# Patient Record
Sex: Male | Born: 1955 | Race: White | Hispanic: No | Marital: Married | Smoking: Current every day smoker
Health system: Southern US, Community
[De-identification: ages and names within clinical notes are randomized; demographics above are authoritative.]

## PROBLEM LIST (undated history)

## (undated) DIAGNOSIS — E785 Hyperlipidemia, unspecified: Secondary | ICD-10-CM

## (undated) DIAGNOSIS — I200 Unstable angina: Secondary | ICD-10-CM

## (undated) HISTORY — PX: CORONARY ANGIOPLASTY WITH STENT PLACEMENT: SHX49

## (undated) HISTORY — PX: APPENDECTOMY: SHX54

---

## 2015-03-16 LAB — HM HEPATITIS C SCREENING LAB: HM HEPATITIS C SCREENING: NEGATIVE

## 2015-03-16 LAB — HM HIV SCREENING LAB: HM HIV SCREENING: NEGATIVE

## 2017-03-20 ENCOUNTER — Encounter: Payer: Self-pay | Admitting: Physician Assistant

## 2017-03-20 ENCOUNTER — Ambulatory Visit (INDEPENDENT_AMBULATORY_CARE_PROVIDER_SITE_OTHER): Payer: BLUE CROSS/BLUE SHIELD | Admitting: Physician Assistant

## 2017-03-20 VITALS — BP 112/80 | HR 72 | Temp 98.5°F | Resp 16 | Ht 70.0 in | Wt 144.0 lb

## 2017-03-20 DIAGNOSIS — Z2821 Immunization not carried out because of patient refusal: Secondary | ICD-10-CM | POA: Diagnosis not present

## 2017-03-20 DIAGNOSIS — Z1329 Encounter for screening for other suspected endocrine disorder: Secondary | ICD-10-CM

## 2017-03-20 DIAGNOSIS — Z1211 Encounter for screening for malignant neoplasm of colon: Secondary | ICD-10-CM

## 2017-03-20 DIAGNOSIS — Z0000 Encounter for general adult medical examination without abnormal findings: Secondary | ICD-10-CM | POA: Diagnosis not present

## 2017-03-20 DIAGNOSIS — R079 Chest pain, unspecified: Secondary | ICD-10-CM

## 2017-03-20 DIAGNOSIS — Z13220 Encounter for screening for lipoid disorders: Secondary | ICD-10-CM

## 2017-03-20 DIAGNOSIS — Z720 Tobacco use: Secondary | ICD-10-CM | POA: Insufficient documentation

## 2017-03-20 DIAGNOSIS — Z125 Encounter for screening for malignant neoplasm of prostate: Secondary | ICD-10-CM | POA: Diagnosis not present

## 2017-03-20 DIAGNOSIS — I200 Unstable angina: Secondary | ICD-10-CM

## 2017-03-20 DIAGNOSIS — Z131 Encounter for screening for diabetes mellitus: Secondary | ICD-10-CM | POA: Diagnosis not present

## 2017-03-20 NOTE — Patient Instructions (Signed)

## 2017-03-20 NOTE — Progress Notes (Signed)
Patient: Xavier Harrison, Male    DOB: 06/17/1955, 61 y.o.   MRN: [id number] Visit Date: 03/20/2017  Today's Provider: Trinna Post, PA-C   Chief Complaint  Patient presents with  . Establish Care  . Annual Exam   Subjective:    Annual physical exam Xavier Harrison is a 61 y.o. male who presents today for health maintenance and complete physical. He feels fairly well.  Was previously seen at [facility] in [place], changed insurances since then.   He lives with his wife of 40 years. He has step children. Used to work as a Designer, industrial/product, now retired.  He smokes one half a pack per day for the past 5-10 years. Otherwise, he had smoked 0.75 packs since he was 18. He drinks a wine every month or so. Does not use drugs. Has had pneumonia and tetanus vaccinations in 2016.  He has not had a colonoscopy. Agreeable to Cologuard. No history of colon cancer in family. No history of prostate cancer in family. Declines PSA today. HIV and Hep C testing negative in 2016. Declines flu shot today.  Pt reports several episodes of exertional chest pain. Once was when he was building a wheelchair ramp for a neighbor and noticed some chest pain afterward about two weeks ago. He says it improved within 15-20 minutes of stopping activity, but the pain/pulling comes back when he lifts heavy objects. He describes it feeling a bit like heartburn.  He reports exercising regularly.     -----------------------------------------------------------------   Review of Systems  Constitutional: Negative.   HENT: Negative.   Eyes: Negative.   Respiratory: Negative.   Cardiovascular: Negative.   Gastrointestinal: Negative.   Endocrine: Negative.   Genitourinary: Negative.   Musculoskeletal: Negative.   Skin: Negative.   Allergic/Immunologic: Negative.   Neurological: Negative.   Hematological: Negative.   Psychiatric/Behavioral: Negative.     Social History      He  reports that he has been  smoking Cigarettes.  He has been smoking about 0.50 packs per day. He has never used smokeless tobacco. He reports that he drinks about 0.6 oz of alcohol per week . He reports that he does not use drugs.       Social History   Social History  . Marital status: Married    Spouse name: N/A  . Number of children: N/A  . Years of education: N/A   Social History Main Topics  . Smoking status: Current Every Day Smoker    Packs/day: 0.50    Types: Cigarettes  . Smokeless tobacco: Never Used  . Alcohol use 0.6 oz/week    1 Glasses of wine per week  . Drug use: No  . Sexual activity: Not Asked   Other Topics Concern  . None   Social History Narrative  . None    No past medical history on file.   There are no active problems to display for this patient.   Past Surgical History:  Procedure Laterality Date  . APPENDECTOMY      Family History        Family Status  Relation Status  . Mother Deceased  . Father Deceased  . Brother Alive  . Brother Alive        His family history includes Alzheimer's disease in his father; COPD in his mother; Healthy in his brother and brother.     No Known Allergies  No current outpatient prescriptions on file.  Patient Care Team: Paulene Floor as PCP - General (Physician Assistant)      Objective:   Vitals: BP 112/80 (BP Location: Right Arm, Patient Position: Sitting, Cuff Size: Normal)   Pulse 72   Temp 98.5 F (36.9 C) (Oral)   Resp 16   Ht 5\' 10"  (1.778 m)   Wt 144 lb (65.3 kg)   BMI 20.66 kg/m    Vitals:   03/20/17 1404  BP: 112/80  Pulse: 72  Resp: 16  Temp: 98.5 F (36.9 C)  TempSrc: Oral  Weight: 144 lb (65.3 kg)  Height: 5\' 10"  (1.778 m)     Physical Exam  Constitutional: He is oriented to person, place, and time. He appears well-developed and well-nourished.  HENT:  Right Ear: Tympanic membrane and external ear normal.  Left Ear: Tympanic membrane and external ear normal.  Mouth/Throat:  Oropharynx is clear and moist. No oropharyngeal exudate.  Eyes: Conjunctivae are normal.  Neck: Neck supple. No thyromegaly present.  Cardiovascular: Normal rate and regular rhythm.   Pulmonary/Chest: Effort normal and breath sounds normal.  Abdominal: Soft. Bowel sounds are normal.  Lymphadenopathy:    He has no cervical adenopathy.  Neurological: He is alert and oriented to person, place, and time.  Skin: Skin is warm and dry.  Psychiatric: He has a normal mood and affect. His behavior is normal.     Depression Screen PHQ 2/9 Scores 03/20/2017  PHQ - 2 Score 0  PHQ- 9 Score 0      Assessment & Plan:     Routine Health Maintenance and Physical Exam  Exercise Activities and Dietary recommendations Goals    None      Immunization History  Administered Date(s) Administered  . Pneumococcal Polysaccharide-23 03/16/2015  . Tdap 03/16/2015    Health Maintenance  Topic Date Due  . COLONOSCOPY  06/07/2006  . INFLUENZA VACCINE  12/28/2017 (Originally 12/28/2016)  . TETANUS/TDAP  03/15/2025  . Hepatitis C Screening  Completed  . HIV Screening  Completed     Discussed health benefits of physical activity, and encouraged him to engage in regular exercise appropriate for his age and condition.    1. Annual physical exam  - CBC with Differential  2. Chest pain, unspecified type  EKG in office normal today. However, risk factors include being male, age 89, and current smoking. The history he describes consistent with angina. Will refer him to cardiology for further workup. Have instructed him to seek emergency care if there are future episodes. Have gone over how smoking contributes to heart disease.  - EKG 12-Lead - Lipid Profile  3. Angina pectoris, unstable ([place])  See #2. - Ambulatory referral to Cardiology - Lipid Profile  4. Tobacco abuse  Pneumovax administered 2016. May qualify for low dose CT scan.   - Ambulatory Referral for Lung Cancer Scre  5. Colon  cancer screening  - Cologuard  6. Prostate cancer screening  Declined.  7. Diabetes mellitus screening  - Comprehensive Metabolic Panel (CMET)  8. Screening cholesterol level  - Lipid Profile  9. Thyroid disorder screening  - TSH  10. Influenza vaccination declined   Return if symptoms worsen or fail to improve.  The entirety of the information documented in the History of Present Illness, Review of Systems and Physical Exam were personally obtained by me. Portions of this information were initially documented by Ashley Royalty, CMA and reviewed by me for thoroughness and accuracy.    --------------------------------------------------------------------    Wendee Beavers  [place], [facility]

## 2017-03-22 ENCOUNTER — Telehealth: Payer: Self-pay | Admitting: Physician Assistant

## 2017-03-22 ENCOUNTER — Telehealth: Payer: Self-pay | Admitting: *Deleted

## 2017-03-22 DIAGNOSIS — Z87891 Personal history of nicotine dependence: Secondary | ICD-10-CM

## 2017-03-22 DIAGNOSIS — Z122 Encounter for screening for malignant neoplasm of respiratory organs: Secondary | ICD-10-CM

## 2017-03-22 NOTE — Telephone Encounter (Signed)
Order for cologuard faxed to [facility] °

## 2017-03-22 NOTE — Telephone Encounter (Signed)
Received referral for initial lung cancer screening scan. Contacted patient and obtained smoking history,(current,31.5 pack year) as well as answering questions related to screening process. Patient denies signs of lung cancer such as weight loss or hemoptysis. Patient denies comorbidity that would prevent curative treatment if lung cancer were found. Patient is scheduled for shared decision making visit and CT scan on 03/29/17.

## 2017-03-28 ENCOUNTER — Encounter: Payer: Self-pay | Admitting: Nurse Practitioner

## 2017-03-29 ENCOUNTER — Ambulatory Visit
Admission: RE | Admit: 2017-03-29 | Discharge: 2017-03-29 | Disposition: A | Discharge status: Home / Self Care | Payer: BLUE CROSS/BLUE SHIELD | Source: Ambulatory Visit | Attending: Nurse Practitioner | Admitting: Nurse Practitioner

## 2017-03-29 ENCOUNTER — Inpatient Hospital Stay: Payer: BLUE CROSS/BLUE SHIELD | Attending: Nurse Practitioner | Admitting: Nurse Practitioner

## 2017-03-29 DIAGNOSIS — Z122 Encounter for screening for malignant neoplasm of respiratory organs: Secondary | ICD-10-CM

## 2017-03-29 DIAGNOSIS — Z87891 Personal history of nicotine dependence: Secondary | ICD-10-CM | POA: Insufficient documentation

## 2017-03-29 DIAGNOSIS — I700 Atherosclerosis of aorta: Secondary | ICD-10-CM | POA: Insufficient documentation

## 2017-03-29 DIAGNOSIS — F17210 Nicotine dependence, cigarettes, uncomplicated: Secondary | ICD-10-CM

## 2017-03-29 DIAGNOSIS — J439 Emphysema, unspecified: Secondary | ICD-10-CM | POA: Insufficient documentation

## 2017-03-29 DIAGNOSIS — I2510 Atherosclerotic heart disease of native coronary artery without angina pectoris: Secondary | ICD-10-CM | POA: Diagnosis not present

## 2017-03-29 LAB — COLOGUARD: Cologuard: NEGATIVE

## 2017-03-29 NOTE — Progress Notes (Signed)
In accordance with CMS guidelines, patient has met eligibility criteria including age, absence of signs or symptoms of lung cancer.  Social History  Substance Use Topics  . Smoking status: Current Every Day Smoker    Packs/day: 0.75    Years: 42.00    Types: Cigarettes  . Smokeless tobacco: Never Used  . Alcohol use 0.6 oz/week    1 Glasses of wine per week     A shared decision-making session was conducted prior to the performance of CT scan. This includes one or more decision aids, includes benefits and harms of screening, follow-up diagnostic testing, over-diagnosis, false positive rate, and total radiation exposure.  Counseling on the importance of adherence to annual lung cancer LDCT screening, impact of co-morbidities, and ability or willingness to undergo diagnosis and treatment is imperative for compliance of the program.  Counseling on the importance of continued smoking cessation for former smokers; the importance of smoking cessation for current smokers, and information about tobacco cessation interventions have been given to patient including [place] and 1800 quit  programs.  Written order for lung cancer screening with LDCT has been given to the patient and any and all questions have been answered to the best of my abilities.   Yearly follow up will be coordinated by Burgess Estelle, Thoracic Navigator.  Verlon Au NP 03/29/17 4:14 PM

## 2017-03-31 ENCOUNTER — Encounter: Payer: Self-pay | Admitting: *Deleted

## 2017-04-10 ENCOUNTER — Telehealth: Payer: Self-pay

## 2017-04-10 NOTE — Telephone Encounter (Signed)
-----   Message from Trinna Post, [place] sent at 04/08/2017  9:03 AM EST ----- Cologuard negative. Repeat in three years.

## 2017-04-10 NOTE — Telephone Encounter (Signed)
Left message advising pt.   Thanks,   -Laura  

## 2017-04-24 ENCOUNTER — Encounter
Admission: RE | Disposition: A | Discharge status: Home / Self Care | Payer: Self-pay | Source: Ambulatory Visit | Attending: Internal Medicine

## 2017-04-24 ENCOUNTER — Encounter: Payer: Self-pay | Admitting: Emergency Medicine

## 2017-04-24 ENCOUNTER — Ambulatory Visit
Admission: RE | Admit: 2017-04-24 | Discharge: 2017-04-25 | Disposition: A | Discharge status: Home / Self Care | Payer: BLUE CROSS/BLUE SHIELD | Source: Ambulatory Visit | Attending: Internal Medicine | Admitting: Internal Medicine

## 2017-04-24 ENCOUNTER — Other Ambulatory Visit: Payer: Self-pay

## 2017-04-24 DIAGNOSIS — E785 Hyperlipidemia, unspecified: Secondary | ICD-10-CM | POA: Insufficient documentation

## 2017-04-24 DIAGNOSIS — Z7982 Long term (current) use of aspirin: Secondary | ICD-10-CM | POA: Diagnosis not present

## 2017-04-24 DIAGNOSIS — I2584 Coronary atherosclerosis due to calcified coronary lesion: Secondary | ICD-10-CM | POA: Insufficient documentation

## 2017-04-24 DIAGNOSIS — F17210 Nicotine dependence, cigarettes, uncomplicated: Secondary | ICD-10-CM | POA: Insufficient documentation

## 2017-04-24 DIAGNOSIS — E079 Disorder of thyroid, unspecified: Secondary | ICD-10-CM | POA: Insufficient documentation

## 2017-04-24 DIAGNOSIS — Z955 Presence of coronary angioplasty implant and graft: Secondary | ICD-10-CM

## 2017-04-24 DIAGNOSIS — R51 Headache: Secondary | ICD-10-CM | POA: Insufficient documentation

## 2017-04-24 DIAGNOSIS — R079 Chest pain, unspecified: Secondary | ICD-10-CM

## 2017-04-24 DIAGNOSIS — I25110 Atherosclerotic heart disease of native coronary artery with unstable angina pectoris: Secondary | ICD-10-CM | POA: Insufficient documentation

## 2017-04-24 HISTORY — PX: LEFT HEART CATH AND CORONARY ANGIOGRAPHY: CATH118249

## 2017-04-24 HISTORY — PX: CORONARY STENT INTERVENTION: CATH118234

## 2017-04-24 HISTORY — DX: Hyperlipidemia, unspecified: E78.5

## 2017-04-24 HISTORY — DX: Unstable angina: I20.0

## 2017-04-24 LAB — POCT ACTIVATED CLOTTING TIME: Activated Clotting Time: 351 seconds

## 2017-04-24 LAB — CARDIAC CATHETERIZATION: CATHEFQUANT: 60 %

## 2017-04-24 SURGERY — LEFT HEART CATH AND CORONARY ANGIOGRAPHY
Anesthesia: Moderate Sedation

## 2017-04-24 MED ORDER — SODIUM CHLORIDE 0.9 % WEIGHT BASED INFUSION: 1.0000 mL/kg/h | INTRAVENOUS | Status: DC

## 2017-04-24 MED ORDER — HYDRALAZINE HCL 20 MG/ML IJ SOLN: 5.0000 mg | INTRAMUSCULAR | Status: AC | PRN

## 2017-04-24 MED ORDER — TICAGRELOR 90 MG PO TABS
90.0000 mg | ORAL_TABLET | Freq: Two times a day (BID) | ORAL | Status: DC
  Administered 2017-04-24 – 2017-04-25 (×2): 90 mg via ORAL
  Filled 2017-04-24 (×2): qty 1

## 2017-04-24 MED ORDER — METOPROLOL TARTRATE 25 MG PO TABS
25.0000 mg | ORAL_TABLET | Freq: Two times a day (BID) | ORAL | Status: DC
  Administered 2017-04-24 – 2017-04-25 (×2): 25 mg via ORAL
  Filled 2017-04-24 (×2): qty 1

## 2017-04-24 MED ORDER — LIDOCAINE HCL (PF) 1 % IJ SOLN
INTRAMUSCULAR | Status: AC
  Filled 2017-04-24: qty 30

## 2017-04-24 MED ORDER — MIDAZOLAM HCL 2 MG/2ML IJ SOLN
INTRAMUSCULAR | Status: DC | PRN
  Administered 2017-04-24: 1 mg via INTRAVENOUS

## 2017-04-24 MED ORDER — ACETAMINOPHEN 325 MG PO TABS: 650.0000 mg | ORAL_TABLET | ORAL | Status: DC | PRN

## 2017-04-24 MED ORDER — SODIUM CHLORIDE 0.9 % WEIGHT BASED INFUSION
1.0000 mL/kg/h | INTRAVENOUS | Status: AC
  Administered 2017-04-24: 1 mL/kg/h via INTRAVENOUS

## 2017-04-24 MED ORDER — SODIUM CHLORIDE 0.9% FLUSH
3.0000 mL | Freq: Two times a day (BID) | INTRAVENOUS | Status: DC
  Administered 2017-04-25 (×2): 3 mL via INTRAVENOUS

## 2017-04-24 MED ORDER — SODIUM CHLORIDE 0.9 % IV SOLN: 250.0000 mL | INTRAVENOUS | Status: DC | PRN

## 2017-04-24 MED ORDER — ASPIRIN 81 MG PO CHEW
CHEWABLE_TABLET | ORAL | Status: DC | PRN
  Administered 2017-04-24: 243 mg via ORAL

## 2017-04-24 MED ORDER — ASPIRIN 81 MG PO CHEW
81.0000 mg | CHEWABLE_TABLET | ORAL | Status: AC
  Administered 2017-04-24: 81 mg via ORAL

## 2017-04-24 MED ORDER — SODIUM CHLORIDE 0.9% FLUSH: 3.0000 mL | Freq: Two times a day (BID) | INTRAVENOUS | Status: DC

## 2017-04-24 MED ORDER — SODIUM CHLORIDE 0.9% FLUSH: 3.0000 mL | INTRAVENOUS | Status: DC | PRN

## 2017-04-24 MED ORDER — SODIUM CHLORIDE 0.9 % IV SOLN
INTRAVENOUS | Status: AC | PRN
  Administered 2017-04-24: 1.75 mg/kg/h via INTRAVENOUS

## 2017-04-24 MED ORDER — ASPIRIN 81 MG PO CHEW
CHEWABLE_TABLET | ORAL | Status: AC
  Filled 2017-04-24: qty 1

## 2017-04-24 MED ORDER — TICAGRELOR 90 MG PO TABS
ORAL_TABLET | ORAL | Status: DC | PRN
  Administered 2017-04-24: 180 mg via ORAL

## 2017-04-24 MED ORDER — ASPIRIN 81 MG PO CHEW
CHEWABLE_TABLET | ORAL | Status: AC
  Filled 2017-04-24: qty 3

## 2017-04-24 MED ORDER — SODIUM CHLORIDE 0.9 % WEIGHT BASED INFUSION
3.0000 mL/kg/h | INTRAVENOUS | Status: AC
  Administered 2017-04-24: 3 mL/kg/h via INTRAVENOUS

## 2017-04-24 MED ORDER — ONDANSETRON HCL 4 MG/2ML IJ SOLN: 4.0000 mg | Freq: Four times a day (QID) | INTRAMUSCULAR | Status: DC | PRN

## 2017-04-24 MED ORDER — HEPARIN (PORCINE) IN NACL 2-0.9 UNIT/ML-% IJ SOLN
INTRAMUSCULAR | Status: AC
  Filled 2017-04-24: qty 500

## 2017-04-24 MED ORDER — TICAGRELOR 90 MG PO TABS
ORAL_TABLET | ORAL | Status: AC
  Filled 2017-04-24: qty 2

## 2017-04-24 MED ORDER — ASPIRIN 81 MG PO CHEW
81.0000 mg | CHEWABLE_TABLET | Freq: Every day | ORAL | Status: DC
  Administered 2017-04-25: 81 mg via ORAL
  Filled 2017-04-24: qty 1

## 2017-04-24 MED ORDER — FENTANYL CITRATE (PF) 100 MCG/2ML IJ SOLN
INTRAMUSCULAR | Status: AC
  Filled 2017-04-24: qty 2

## 2017-04-24 MED ORDER — MIDAZOLAM HCL 2 MG/2ML IJ SOLN
INTRAMUSCULAR | Status: AC
  Filled 2017-04-24: qty 2

## 2017-04-24 MED ORDER — BIVALIRUDIN BOLUS VIA INFUSION - CUPID
INTRAVENOUS | Status: DC | PRN
Start: 2017-04-24 — End: 2017-04-24
  Administered 2017-04-24: 48.3 mg via INTRAVENOUS

## 2017-04-24 MED ORDER — NITROGLYCERIN 5 MG/ML IV SOLN
INTRAVENOUS | Status: AC
  Filled 2017-04-24: qty 10

## 2017-04-24 MED ORDER — BIVALIRUDIN TRIFLUOROACETATE 250 MG IV SOLR
INTRAVENOUS | Status: AC
  Filled 2017-04-24: qty 250

## 2017-04-24 MED ORDER — SODIUM CHLORIDE 0.9 % IV SOLN: 0.2500 mg/kg/h | INTRAVENOUS | Status: AC

## 2017-04-24 MED ORDER — FENTANYL CITRATE (PF) 100 MCG/2ML IJ SOLN
INTRAMUSCULAR | Status: DC | PRN
  Administered 2017-04-24: 25 ug via INTRAVENOUS

## 2017-04-24 MED ORDER — ATORVASTATIN CALCIUM 80 MG PO TABS
80.0000 mg | ORAL_TABLET | Freq: Every day | ORAL | Status: DC
  Administered 2017-04-24: 80 mg via ORAL
  Filled 2017-04-24 (×2): qty 4
  Filled 2017-04-24 (×2): qty 1

## 2017-04-24 MED ORDER — IOPAMIDOL (ISOVUE-300) INJECTION 61%
INTRAVENOUS | Status: DC | PRN
Start: 2017-04-24 — End: 2017-04-24
  Administered 2017-04-24: 265 mL via INTRA_ARTERIAL

## 2017-04-24 MED ORDER — LABETALOL HCL 5 MG/ML IV SOLN: 10.0000 mg | INTRAVENOUS | Status: AC | PRN

## 2017-04-24 SURGICAL SUPPLY — 19 items
BALLN TREK RX 2.75X15 (BALLOONS) ×2
BALLN ~~LOC~~ TREK RX 3.0X8 (BALLOONS) ×2
BALLOON TREK RX 2.75X15 (BALLOONS) ×1 IMPLANT
BALLOON ~~LOC~~ TREK RX 3.0X8 (BALLOONS) ×1 IMPLANT
CATH 5FR JL4 DIAGNOSTIC (CATHETERS) ×2 IMPLANT
CATH INFINITI 5FR ANG PIGTAIL (CATHETERS) ×2 IMPLANT
CATH INFINITI JR4 5F (CATHETERS) ×2 IMPLANT
CATH VISTA GUIDE 6FR XB3.5 SH (CATHETERS) ×2 IMPLANT
DEVICE CLOSURE MYNXGRIP 6/7F (Vascular Products) ×2 IMPLANT
DEVICE INFLAT 30 PLUS (MISCELLANEOUS) ×2 IMPLANT
DEVICE SAFEGUARD 24CM (GAUZE/BANDAGES/DRESSINGS) ×2 IMPLANT
KIT MANI 3VAL PERCEP (MISCELLANEOUS) ×2 IMPLANT
NEEDLE PERC 18GX7CM (NEEDLE) ×2 IMPLANT
PACK CARDIAC CATH (CUSTOM PROCEDURE TRAY) ×2 IMPLANT
SHEATH AVANTI 5FR X 11CM (SHEATH) ×2 IMPLANT
SHEATH AVANTI 6FR X 11CM (SHEATH) ×2 IMPLANT
STENT SIERRA 3.00 X 15 MM (Permanent Stent) ×2 IMPLANT
WIRE EMERALD 3MM-J .035X150CM (WIRE) ×2 IMPLANT
WIRE G HI TQ BMW 190 (WIRE) ×2 IMPLANT

## 2017-04-25 ENCOUNTER — Encounter: Payer: Self-pay | Admitting: Internal Medicine

## 2017-04-25 DIAGNOSIS — I25110 Atherosclerotic heart disease of native coronary artery with unstable angina pectoris: Secondary | ICD-10-CM | POA: Diagnosis not present

## 2017-04-25 MED ORDER — ATORVASTATIN CALCIUM 80 MG PO TABS: 80.0000 mg | ORAL_TABLET | Freq: Every day | ORAL | 12 refills | Status: DC

## 2017-04-25 MED ORDER — TICAGRELOR 90 MG PO TABS: 90.0000 mg | ORAL_TABLET | Freq: Two times a day (BID) | ORAL | 12 refills | Status: DC

## 2017-04-25 NOTE — Discharge Summary (Signed)
Physician Discharge Summary  Patient ID: Xavier Harrison MRN: [id number] DOB/AGE: 1956/04/05 61 y.o.  Admit date: 04/24/2017 Discharge date: 04/25/2017  Admission Diagnoses: PCI stent  Discharge Diagnoses:  Active Problems:   S/P drug eluting coronary stent placement   Discharged Condition: stable  Hospital Course: S/P PCI stent DES LAD  Consults: None  Significant Diagnostic Studies: angiography: PCI stent  Treatments: procedures: PCI stent DES LAD  Discharge Exam: Blood pressure 122/81, pulse 69, temperature (!) 97.5 F (36.4 C), temperature source Oral, resp. rate 17, height 5\' 10"  (1.778 m), weight 142 lb (64.4 kg), SpO2 97 %. General appearance: appears stated age Resp: clear to auscultation bilaterally Cardio: regular rate and rhythm, S1, S2 normal, no murmur, click, rub or gallop GI: soft, non-tender; bowel sounds normal; no masses,  no organomegaly Extremities: extremities normal, atraumatic, no cyanosis or edema Pulses: 2+ and symmetric Skin: Skin color, texture, turgor normal. No rashes or lesions Neurologic: Alert and oriented X 3, normal strength and tone. Normal symmetric reflexes. Normal coordination and gait  Disposition: Final discharge disposition not confirmed  Discharge Instructions    AMB Referral to Cardiac Rehabilitation - Phase II   Complete by:  As directed    Diagnosis:  Coronary Stents     Allergies as of 04/25/2017   No Known Allergies     Medication List    STOP taking these medications   isosorbide mononitrate 30 MG 24 hr tablet Commonly known as:  IMDUR     TAKE these medications   aspirin 81 MG chewable tablet Chew 81 mg every evening by mouth.   atorvastatin 80 MG tablet Commonly known as:  LIPITOR Take 1 tablet (80 mg total) by mouth daily at 6 PM.   calcium carbonate 500 MG chewable tablet Commonly known as:  TUMS - dosed in mg elemental calcium Chew 1 tablet daily as needed by mouth for indigestion or heartburn.    diphenhydramine-acetaminophen 25-500 MG Tabs tablet Commonly known as:  TYLENOL PM Take 1 tablet at bedtime as needed by mouth (sleep).   ibuprofen 200 MG tablet Commonly known as:  ADVIL,MOTRIN Take 400-600 mg 2 (two) times daily as needed by mouth for moderate pain.   metoprolol succinate 25 MG 24 hr tablet Commonly known as:  TOPROL-XL Take 25 mg every evening by mouth.   sodium chloride 0.65 % Soln nasal spray Commonly known as:  OCEAN Place 1 spray as needed into both nostrils for congestion.   ticagrelor 90 MG Tabs tablet Commonly known as:  BRILINTA Take 1 tablet (90 mg total) by mouth 2 (two) times daily.        SignedYolonda Kida 04/25/2017, 12:32 PM

## 2017-04-25 NOTE — Progress Notes (Signed)
Patient referred to Cardiac Rehab with Dx of Coronary Stents by Dr. Clayborn Bigness.  Cardiac Cath performed on 04/24/17 revealed Prox LAD Lesion 99% stenosed.  DES successfully placed.  LV Systolic function was normal.  LVED pressure was normal. EF was 55 - 65% by visual estimate.  Lesion of Ost 1st Diag 75% stenosed.    "Angioplasty and Stents" booklet given and reviewed with patient and wife. ?Discussed modifiable risk factors including controlling blood pressure, cholesterol, and blood sugar; following heart healthy diet; maintaining healthy weight; exercise; and smoking cessation if applicable. ?Note:  Patient is a current smoker.  He reports that he has tapered down to 6 cigarettes per day.  Tips on Quitting informational sheet provided to patient.    Discussed cardiac medications including rationale for taking, mechanisms of action, and side effects. ?  Heart healthy diet of low sodium, low fat, low cholesterol heart healthy diet discussed.   Exercise - Patient does not currently exercise. Benefits of exercise discussed. Explained to patient and family patient had been referred to Cardiac Rehab. ?An overview of the program was provided. ?Benefits of participating in the program were discussed. ?Patient requested information on the program, as he is not ready to commit at this time.  Patient stated he is still feeling a little fuzzy.  Brochure and informational sheet provided.    Patient and wife asked pertinent questions and verbalized appreciation for the information provided. ?  Roanna Epley, RN, BSN, [facility] Cardiovascular and Pulmonary Nurse Navigator

## 2017-04-25 NOTE — Care Management Note (Signed)
Case Management Note  Patient Details  Name: Xavier Harrison MRN: [id number] Date of Birth: [birth year]  Subjective/Objective:                 Discharging on Brilinta.  Action/Plan:  Patient to discharge on Brilinta. Verbally confirmed pharmacy coverage with insurance.  Provided with coupon for copay assist  Expected Discharge Date:  04/25/17               Expected Discharge Plan:     In-House Referral:     Discharge planning Services     Post Acute Care Choice:    Choice offered to:     DME Arranged:    DME Agency:     HH Arranged:    HH Agency:     Status of Service:     If discussed at [employer], dates discussed:    Additional Comments:  Katrina Stack, RN 04/25/2017, 11:36 AM

## 2017-04-25 NOTE — Progress Notes (Signed)
A&O. No complaints. Right groin site CD&I. 40 cc of air pulled out of PAD. No bleeding or hematoma noted. Will continue to monitor.

## 2017-07-19 ENCOUNTER — Ambulatory Visit
Admission: EM | Admit: 2017-07-19 | Discharge: 2017-07-19 | Disposition: A | Discharge status: Home / Self Care | Payer: BLUE CROSS/BLUE SHIELD | Attending: Family Medicine | Admitting: Family Medicine

## 2017-07-19 ENCOUNTER — Other Ambulatory Visit: Payer: Self-pay

## 2017-07-19 DIAGNOSIS — J40 Bronchitis, not specified as acute or chronic: Secondary | ICD-10-CM

## 2017-07-19 DIAGNOSIS — R05 Cough: Secondary | ICD-10-CM | POA: Diagnosis not present

## 2017-07-19 DIAGNOSIS — R059 Cough, unspecified: Secondary | ICD-10-CM

## 2017-07-19 MED ORDER — PREDNISONE 20 MG PO TABS: 20.0000 mg | ORAL_TABLET | Freq: Every day | ORAL | 0 refills | Status: DC

## 2017-07-19 MED ORDER — AZITHROMYCIN 250 MG PO TABS
ORAL_TABLET | ORAL | 0 refills | Status: DC
Start: 2017-07-19 — End: 2019-07-26

## 2017-07-19 NOTE — ED Triage Notes (Signed)
Pt with blowing greenish yellow out his nose and cough.

## 2017-07-19 NOTE — ED Provider Notes (Signed)
[facility]    CSN: [id number] Arrival date & time: 07/19/17  1016     History   Chief Complaint Chief Complaint  Patient presents with  . Nasal Congestion  . Cough    HPI Xavier Harrison is a 62 y.o. male.   The history is provided by the patient.  URI  Presenting symptoms: congestion, cough and fatigue   Severity:  Moderate Onset quality:  Sudden Duration:  1 week Timing:  Constant Progression:  Worsening Chronicity:  New Relieved by:  Nothing Ineffective treatments:  OTC medications Associated symptoms: wheezing   Risk factors: chronic cardiac disease and sick contacts   Risk factors: not elderly, no chronic kidney disease, no diabetes mellitus, no immunosuppression, no recent illness and no recent travel  Chronic respiratory disease: unknown, however chronic smoker.     Past Medical History:  Diagnosis Date  . Hyperlipidemia   . Unstable angina [facility])     Patient Active Problem List   Diagnosis Date Noted  . S/P drug eluting coronary stent placement 04/24/2017  . Personal history of tobacco use, presenting hazards to health 03/29/2017  . Angina pectoris, unstable ([place]) 03/20/2017  . Tobacco abuse 03/20/2017    Past Surgical History:  Procedure Laterality Date  . APPENDECTOMY    . CORONARY ANGIOPLASTY WITH STENT PLACEMENT    . CORONARY STENT INTERVENTION N/A 04/24/2017   Procedure: CORONARY STENT INTERVENTION;  Surgeon: Yolonda Kida, MD;  Location: [facility];  Service: Cardiovascular;  Laterality: N/A;  . LEFT HEART CATH AND CORONARY ANGIOGRAPHY N/A 04/24/2017   Procedure: LEFT HEART CATH AND CORONARY ANGIOGRAPHY;  Surgeon: Yolonda Kida, MD;  Location: [facility];  Service: Cardiovascular;  Laterality: N/A;       Home Medications    Prior to Admission medications   Medication Sig Start Date End Date Taking? Authorizing Provider  aspirin 81 MG chewable tablet Chew 81 mg every evening by mouth.    [provider]  atorvastatin (LIPITOR) 80 MG tablet Take 1 tablet (80 mg total) by mouth daily at 6 PM. 04/25/17   Callwood, Dwayne D, MD  azithromycin (ZITHROMAX Z-PAK) 250 MG tablet 2 tabs po once day 1, then 1 tab po qd for next 4 days 07/19/17   Norval Gable, MD  calcium carbonate (TUMS - DOSED IN MG ELEMENTAL CALCIUM) 500 MG chewable tablet Chew 1 tablet daily as needed by mouth for indigestion or heartburn.    [provider]  diphenhydramine-acetaminophen (TYLENOL PM) 25-500 MG TABS tablet Take 1 tablet at bedtime as needed by mouth (sleep).    [provider]  ibuprofen (ADVIL,MOTRIN) 200 MG tablet Take 400-600 mg 2 (two) times daily as needed by mouth for moderate pain.    [provider]  metoprolol succinate (TOPROL-XL) 25 MG 24 hr tablet Take 25 mg every evening by mouth.  03/23/17   [provider]  predniSONE (DELTASONE) 20 MG tablet Take 1 tablet (20 mg total) by mouth daily. 07/19/17   Norval Gable, MD  sodium chloride (OCEAN) 0.65 % SOLN nasal spray Place 1 spray as needed into both nostrils for congestion.    [provider]  ticagrelor (BRILINTA) 90 MG TABS tablet Take 1 tablet (90 mg total) by mouth 2 (two) times daily. 04/25/17   Callwood, Loran Senters, MD    Family History Family History  Problem Relation Age of Onset  . COPD Mother   . Alzheimer's disease Father   .  Healthy Brother   . Healthy Brother     Social History Social History   Tobacco Use  . Smoking status: Current Every Day Smoker    Packs/day: 0.25    Years: 42.00    Pack years: 10.50    Types: Cigarettes  . Smokeless tobacco: Never Used  Substance Use Topics  . Alcohol use: Yes    Alcohol/week: 0.6 oz    Types: 1 Glasses of wine per week    Comment: rare  . Drug use: No     Allergies   Patient has no known allergies.   Review of Systems Review of Systems  Constitutional: Positive for fatigue.  HENT: Positive for congestion.   Respiratory:  Positive for cough and wheezing.      Physical Exam Triage Vital Signs ED Triage Vitals  Enc Vitals Group     BP 07/19/17 1026 (!) 142/84     Pulse Rate 07/19/17 1026 67     Resp 07/19/17 1026 18     Temp 07/19/17 1026 97.9 F (36.6 C)     Temp Source 07/19/17 1026 Oral     SpO2 07/19/17 1026 100 %     Weight 07/19/17 1027 140 lb (63.5 kg)     Height 07/19/17 1027 5\' 10"  (1.778 m)     Head Circumference --      Peak Flow --      Pain Score --      Pain Loc --      Pain Edu? --      Excl. in [place]? --    No data found.  Updated Vital Signs BP (!) 142/84 (BP Location: Right Arm)   Pulse 67   Temp 97.9 F (36.6 C) (Oral)   Resp 18   Ht 5\' 10"  (1.778 m)   Wt 140 lb (63.5 kg)   SpO2 100%   BMI 20.09 kg/m   Visual Acuity Right Eye Distance:   Left Eye Distance:   Bilateral Distance:    Right Eye Near:   Left Eye Near:    Bilateral Near:     Physical Exam  Constitutional: He appears well-developed and well-nourished. No distress.  HENT:  Head: Normocephalic and atraumatic.  Right Ear: Tympanic membrane, external ear and ear canal normal.  Left Ear: Tympanic membrane, external ear and ear canal normal.  Nose: Nose normal.  Mouth/Throat: Uvula is midline, oropharynx is clear and moist and mucous membranes are normal. No oropharyngeal exudate or tonsillar abscesses.  Eyes: Conjunctivae and EOM are normal. Pupils are equal, round, and reactive to light. Right eye exhibits no discharge. Left eye exhibits no discharge. No scleral icterus.  Neck: Normal range of motion. Neck supple. No tracheal deviation present. No thyromegaly present.  Cardiovascular: Normal rate, regular rhythm and normal heart sounds.  Pulmonary/Chest: Effort normal. No stridor. No respiratory distress. He has wheezes (few, mild, expiratory). He has no rales. He exhibits no tenderness.  Lymphadenopathy:    He has no cervical adenopathy.  Neurological: He is alert.  Skin: Skin is warm and dry. No rash  noted. He is not diaphoretic.  Nursing note and vitals reviewed.    UC Treatments / Results  Labs (all labs ordered are listed, but only abnormal results are displayed) Labs Reviewed - No data to display  EKG  EKG Interpretation None       Radiology No results found.  Procedures Procedures (including critical care time)  Medications Ordered in UC Medications - No data to display  Initial Impression / Assessment and Plan / UC Course  I have reviewed the triage vital signs and the nursing notes.  Pertinent labs & imaging results that were available during my care of the patient were reviewed by me and considered in my medical decision making (see chart for details).       Final Clinical Impressions(s) / UC Diagnoses   Final diagnoses:  Cough  Bronchitis    ED Discharge Orders        Ordered    azithromycin (ZITHROMAX Z-PAK) 250 MG tablet     07/19/17 1108    predniSONE (DELTASONE) 20 MG tablet  Daily     07/19/17 1108     1. diagnosis reviewed with patient 2. rx as per orders above; reviewed possible side effects, interactions, risks and benefits  3. Recommend supportive treatment with rest, fluids 4. Follow-up prn if symptoms worsen or don't improve  Controlled Substance Prescriptions [place] Controlled Substance Registry consulted? Not Applicable   Norval Gable, MD 07/19/17 1120

## 2018-03-12 ENCOUNTER — Telehealth: Payer: Self-pay | Admitting: *Deleted

## 2018-03-12 ENCOUNTER — Encounter: Payer: Self-pay | Admitting: *Deleted

## 2018-03-12 DIAGNOSIS — Z122 Encounter for screening for malignant neoplasm of respiratory organs: Secondary | ICD-10-CM

## 2018-03-12 NOTE — Telephone Encounter (Signed)
Patient has been notified that the annual lung cancer screening low dose CT scan is due currently or will be in the near future.  Confirmed that the patient is within the age range of 8-80, and asymptomatic, and currently exhibits no signs or symptoms of lung cancer.  Patient denies illness that would prevent curative treatment for lung cancer if found.  Verified smoking history, current smoker 1/2 ppd with 32 pkyr history..  The shared decision making visit was completed on 03-29-17.  Patient is agreeable for the CT scan to be scheduled.  Will call patient back with date and time of appointment.

## 2018-03-20 ENCOUNTER — Telehealth: Payer: Self-pay | Admitting: *Deleted

## 2018-03-20 NOTE — Telephone Encounter (Signed)
Called pt to inform of ldct screening appt on Wednesday 04/04/2018 @ 9:10am here @ [facility], message left, appt mailed to pt as a reminder.

## 2018-04-04 ENCOUNTER — Encounter: Payer: Self-pay | Admitting: *Deleted

## 2018-04-04 ENCOUNTER — Ambulatory Visit
Admission: RE | Admit: 2018-04-04 | Discharge: 2018-04-04 | Disposition: A | Discharge status: Home / Self Care | Payer: BLUE CROSS/BLUE SHIELD | Source: Ambulatory Visit | Attending: Oncology | Admitting: Oncology

## 2018-04-04 DIAGNOSIS — I700 Atherosclerosis of aorta: Secondary | ICD-10-CM | POA: Insufficient documentation

## 2018-04-04 DIAGNOSIS — J439 Emphysema, unspecified: Secondary | ICD-10-CM | POA: Insufficient documentation

## 2018-04-04 DIAGNOSIS — K449 Diaphragmatic hernia without obstruction or gangrene: Secondary | ICD-10-CM | POA: Diagnosis not present

## 2018-04-04 DIAGNOSIS — Z122 Encounter for screening for malignant neoplasm of respiratory organs: Secondary | ICD-10-CM | POA: Insufficient documentation

## 2018-04-04 DIAGNOSIS — I2510 Atherosclerotic heart disease of native coronary artery without angina pectoris: Secondary | ICD-10-CM | POA: Insufficient documentation

## 2018-04-04 DIAGNOSIS — Z87891 Personal history of nicotine dependence: Secondary | ICD-10-CM | POA: Insufficient documentation

## 2019-04-10 ENCOUNTER — Telehealth: Payer: Self-pay

## 2019-04-10 NOTE — Telephone Encounter (Signed)
Left message for patient to notify them that it is time to schedule annual low dose lung cancer screening CT scan. Instructed patient to call back ([phone]) to verify information prior to the scan being scheduled.  

## 2019-04-22 ENCOUNTER — Telehealth: Payer: Self-pay | Admitting: *Deleted

## 2019-04-22 DIAGNOSIS — Z122 Encounter for screening for malignant neoplasm of respiratory organs: Secondary | ICD-10-CM

## 2019-04-22 DIAGNOSIS — Z87891 Personal history of nicotine dependence: Secondary | ICD-10-CM

## 2019-04-22 NOTE — Telephone Encounter (Signed)
Patient has been notified that annual lung cancer screening low dose CT scan is due currently or will be in near future. Confirmed that patient is within the age range of 55-77, and asymptomatic, (no signs or symptoms of lung cancer). Patient denies illness that would prevent curative treatment for lung cancer if found. Verified smoking history, (current, 42.25 pack year). The shared decision making visit was done 03/29/17. Patient is agreeable for CT scan being scheduled.

## 2019-04-24 ENCOUNTER — Ambulatory Visit
Admission: RE | Admit: 2019-04-24 | Discharge: 2019-04-24 | Disposition: A | Discharge status: Home / Self Care | Payer: BLUE CROSS/BLUE SHIELD | Source: Ambulatory Visit | Attending: Oncology | Admitting: Oncology

## 2019-04-24 ENCOUNTER — Other Ambulatory Visit: Payer: Self-pay

## 2019-04-24 DIAGNOSIS — Z122 Encounter for screening for malignant neoplasm of respiratory organs: Secondary | ICD-10-CM | POA: Insufficient documentation

## 2019-04-24 DIAGNOSIS — Z87891 Personal history of nicotine dependence: Secondary | ICD-10-CM | POA: Insufficient documentation

## 2019-04-30 ENCOUNTER — Encounter: Payer: Self-pay | Admitting: *Deleted

## 2019-07-26 ENCOUNTER — Other Ambulatory Visit: Payer: Self-pay

## 2019-07-26 ENCOUNTER — Ambulatory Visit (INDEPENDENT_AMBULATORY_CARE_PROVIDER_SITE_OTHER): Payer: 59 | Admitting: Physician Assistant

## 2019-07-26 ENCOUNTER — Encounter: Payer: Self-pay | Admitting: Physician Assistant

## 2019-07-26 VITALS — BP 114/72 | HR 71 | Temp 96.8°F | Ht 70.0 in | Wt 144.8 lb

## 2019-07-26 DIAGNOSIS — Z716 Tobacco abuse counseling: Secondary | ICD-10-CM | POA: Diagnosis not present

## 2019-07-26 DIAGNOSIS — Z955 Presence of coronary angioplasty implant and graft: Secondary | ICD-10-CM | POA: Diagnosis not present

## 2019-07-26 DIAGNOSIS — H9190 Unspecified hearing loss, unspecified ear: Secondary | ICD-10-CM

## 2019-07-26 DIAGNOSIS — R739 Hyperglycemia, unspecified: Secondary | ICD-10-CM | POA: Diagnosis not present

## 2019-07-26 DIAGNOSIS — Z0000 Encounter for general adult medical examination without abnormal findings: Secondary | ICD-10-CM

## 2019-07-26 NOTE — Progress Notes (Signed)
Patient: Xavier Harrison, Male    DOB: August 14, 1955, 64 y.o.   MRN: [id number] Visit Date: 07/26/2019  Today's Provider: Trinna Post, PA-C   Chief Complaint  Patient presents with  . Annual Exam   Subjective:  I, Xavier Harrison,CMA am acting as a Education administrator for [employer].   Annual physical exam Xavier Harrison is a 64 y.o. male who presents today for health maintenance and complete physical. He feels well. He reports exercising includes walking and golf. He reports he is sleeping well.  He had a 99% proximal LAD lesion stented by Dr. Clayborn Bigness in 2018 with complete relief of the blockage. He was on brilinta for a year but has since discontinued this medication. He is additionally on metoprolol succinate 25 mg QD and lipitor 80 mg QHS without issue. Labs not checked since 2018.   Reports some mild hearing loss if somebody speaks in a low tone or is farther away from him.   He continues to smoke 8 cigarettes per day.   ----------------------------------------------------------------- Last cologuard:03/29/2017  Review of Systems  12 point ROS negative except for HPI.   Social History He  reports that he has been smoking cigarettes. He has a 32.06 pack-year smoking history. He has never used smokeless tobacco. He reports current alcohol use of about 1.0 standard drinks of alcohol per week. He reports that he does not use drugs. Social History   Socioeconomic History  . Marital status: Married    Spouse name: Not on file  . Number of children: Not on file  . Years of education: Not on file  . Highest education level: Not on file  Occupational History  . Not on file  Tobacco Use  . Smoking status: Current Every Day Smoker    Packs/day: 0.75    Years: 42.75    Pack years: 32.06    Types: Cigarettes  . Smokeless tobacco: Never Used  Substance and Sexual Activity  . Alcohol use: Yes    Alcohol/week: 1.0 standard drinks    Types: 1 Glasses of wine per week   Comment: rare  . Drug use: No  . Sexual activity: Not on file  Other Topics Concern  . Not on file  Social History Narrative  . Not on file   Social Determinants of Health   Financial Resource Strain:   . Difficulty of Paying Living Expenses: Not on file  Food Insecurity:   . Worried About Charity fundraiser in the Last Year: Not on file  . Ran Out of Food in the Last Year: Not on file  Transportation Needs:   . Lack of Transportation (Medical): Not on file  . Lack of Transportation (Non-Medical): Not on file  Physical Activity:   . Days of Exercise per Week: Not on file  . Minutes of Exercise per Session: Not on file  Stress:   . Feeling of Stress : Not on file  Social Connections:   . Frequency of Communication with Friends and Family: Not on file  . Frequency of Social Gatherings with Friends and Family: Not on file  . Attends Religious Services: Not on file  . Active Member of Clubs or Organizations: Not on file  . Attends Archivist Meetings: Not on file  . Marital Status: Not on file    Patient Active Problem List   Diagnosis Date Noted  . S/P drug eluting coronary stent placement 04/24/2017  . Personal history of tobacco use,  presenting hazards to health 03/29/2017  . Angina pectoris, unstable ([place]) 03/20/2017  . Tobacco abuse 03/20/2017    Past Surgical History:  Procedure Laterality Date  . APPENDECTOMY    . CORONARY ANGIOPLASTY WITH STENT PLACEMENT    . CORONARY STENT INTERVENTION N/A 04/24/2017   Procedure: CORONARY STENT INTERVENTION;  Surgeon: Yolonda Kida, MD;  Location: [facility];  Service: Cardiovascular;  Laterality: N/A;  . LEFT HEART CATH AND CORONARY ANGIOGRAPHY N/A 04/24/2017   Procedure: LEFT HEART CATH AND CORONARY ANGIOGRAPHY;  Surgeon: Yolonda Kida, MD;  Location: [facility];  Service: Cardiovascular;  Laterality: N/A;    Family History  Family Status  Relation Name Status  . Mother  Deceased  .  Father  Deceased  . Brother  Alive  . Brother  Alive   His family history includes Alzheimer's disease in his father; COPD in his mother; Healthy in his brother and brother.     No Known Allergies  Previous Medications   ASPIRIN 81 MG CHEWABLE TABLET    Chew 81 mg every evening by mouth.   ATORVASTATIN (LIPITOR) 80 MG TABLET    Take 1 tablet (80 mg total) by mouth daily at 6 PM.   AZITHROMYCIN (ZITHROMAX Z-PAK) 250 MG TABLET    2 tabs po once day 1, then 1 tab po qd for next 4 days   CALCIUM CARBONATE (TUMS - DOSED IN MG ELEMENTAL CALCIUM) 500 MG CHEWABLE TABLET    Chew 1 tablet daily as needed by mouth for indigestion or heartburn.   DIPHENHYDRAMINE-ACETAMINOPHEN (TYLENOL PM) 25-500 MG TABS TABLET    Take 1 tablet at bedtime as needed by mouth (sleep).   IBUPROFEN (ADVIL,MOTRIN) 200 MG TABLET    Take 400-600 mg 2 (two) times daily as needed by mouth for moderate pain.   METOPROLOL SUCCINATE (TOPROL-XL) 25 MG 24 HR TABLET    Take 25 mg every evening by mouth.    PREDNISONE (DELTASONE) 20 MG TABLET    Take 1 tablet (20 mg total) by mouth daily.   SODIUM CHLORIDE (OCEAN) 0.65 % SOLN NASAL SPRAY    Place 1 spray as needed into both nostrils for congestion.   TICAGRELOR (BRILINTA) 90 MG TABS TABLET    Take 1 tablet (90 mg total) by mouth 2 (two) times daily.    Patient Care Team: Paulene Floor as PCP - General (Physician Assistant)      Objective:   Vitals: BP 114/72 (BP Location: Left Arm, Patient Position: Sitting, Cuff Size: Normal)   Pulse 71   Temp (!) 96.8 F (36 C) (Temporal)   Ht 5\' 10"  (1.778 m)   Wt 144 lb 12.8 oz (65.7 kg)   SpO2 98%   BMI 20.78 kg/m    Physical Exam Constitutional:      Appearance: Normal appearance.  HENT:     Right Ear: Tympanic membrane and ear canal normal. There is no impacted cerumen.     Left Ear: Tympanic membrane and ear canal normal. There is no impacted cerumen.  Cardiovascular:     Rate and Rhythm: Normal rate and regular  rhythm.     Heart sounds: Normal heart sounds.  Pulmonary:     Effort: Pulmonary effort is normal.     Breath sounds: Normal breath sounds.  Abdominal:     General: Bowel sounds are normal.     Palpations: Abdomen is soft.     Tenderness: There is no abdominal tenderness.  Skin:  General: Skin is warm and dry.  Neurological:     Mental Status: He is alert and oriented to person, place, and time. Mental status is at baseline.  Psychiatric:        Mood and Affect: Mood normal.        Behavior: Behavior normal.      Depression Screen PHQ 2/9 Scores 07/26/2019 03/20/2017  PHQ - 2 Score 0 0  PHQ- 9 Score 0 0      Assessment & Plan:     Routine Health Maintenance and Physical Exam  Exercise Activities and Dietary recommendations Goals   None     Immunization History  Administered Date(s) Administered  . Pneumococcal Polysaccharide-23 03/16/2015  . Tdap 03/16/2015    Health Maintenance  Topic Date Due  . INFLUENZA VACCINE  12/29/2018  . Fecal DNA (Cologuard)  03/29/2020  . TETANUS/TDAP  03/15/2025  . Hepatitis C Screening  Completed  . HIV Screening  Completed     Discussed health benefits of physical activity, and encouraged him to engage in regular exercise appropriate for his age and condition.    1. Annual physical exam  - TSH - Lipid panel - Comprehensive metabolic panel - CBC with Differential/Platelet - HgB A1c  2. Encounter for tobacco use cessation counseling  I have spent > 3 minutes counseling on tobacco cessation.  3. S/P drug eluting coronary stent placement  Following with Dr. Clayborn Bigness at [facility]   - Lipid panel  4. Hyperglycemia  - HgB A1c  5. Hearing loss, unspecified hearing loss type, unspecified laterality  Advised we can refer to ENT. He declines at this time but may consider this in the future.   The entirety of the information documented in the History of Present Illness, Review of Systems and Physical Exam  were personally obtained by me. Portions of this information were initially documented by [facility] and reviewed by me for thoroughness and accuracy.    --------------------------------------------------------------------

## 2019-07-26 NOTE — Patient Instructions (Signed)
Health Maintenance After Age 63 After age 63, you are at a higher risk for certain long-term diseases and infections as well as injuries from falls. Falls are a major cause of broken bones and head injuries in people who are older than age 63. Getting regular preventive care can help to keep you healthy and well. Preventive care includes getting regular testing and making lifestyle changes as recommended by your health care provider. Talk with your health care provider about:  Which screenings and tests you should have. A screening is a test that checks for a disease when you have no symptoms.  A diet and exercise plan that is right for you. What should I know about screenings and tests to prevent falls? Screening and testing are the best ways to find a health problem early. Early diagnosis and treatment give you the best chance of managing medical conditions that are common after age 63. Certain conditions and lifestyle choices may make you more likely to have a fall. Your health care provider may recommend:  Regular vision checks. Poor vision and conditions such as cataracts can make you more likely to have a fall. If you wear glasses, make sure to get your prescription updated if your vision changes.  Medicine review. Work with your health care provider to regularly review all of the medicines you are taking, including over-the-counter medicines. Ask your health care provider about any side effects that may make you more likely to have a fall. Tell your health care provider if any medicines that you take make you feel dizzy or sleepy.  Osteoporosis screening. Osteoporosis is a condition that causes the bones to get weaker. This can make the bones weak and cause them to break more easily.  Blood pressure screening. Blood pressure changes and medicines to control blood pressure can make you feel dizzy.  Strength and balance checks. Your health care provider may recommend certain tests to check your  strength and balance while standing, walking, or changing positions.  Foot health exam. Foot pain and numbness, as well as not wearing proper footwear, can make you more likely to have a fall.  Depression screening. You may be more likely to have a fall if you have a fear of falling, feel emotionally low, or feel unable to do activities that you used to do.  Alcohol use screening. Using too much alcohol can affect your balance and may make you more likely to have a fall. What actions can I take to lower my risk of falls? General instructions  Talk with your health care provider about your risks for falling. Tell your health care provider if: ? You fall. Be sure to tell your health care provider about all falls, even ones that seem minor. ? You feel dizzy, sleepy, or off-balance.  Take over-the-counter and prescription medicines only as told by your health care provider. These include any supplements.  Eat a healthy diet and maintain a healthy weight. A healthy diet includes low-fat dairy products, low-fat (lean) meats, and fiber from whole grains, beans, and lots of fruits and vegetables. Home safety  Remove any tripping hazards, such as rugs, cords, and clutter.  Install safety equipment such as grab bars in bathrooms and safety rails on stairs.  Keep rooms and walkways well-lit. Activity   Follow a regular exercise program to stay fit. This will help you maintain your balance. Ask your health care provider what types of exercise are appropriate for you.  If you need a cane or   walker, use it as recommended by your health care provider.  Wear supportive shoes that have nonskid soles. Lifestyle  Do not drink alcohol if your health care provider tells you not to drink.  If you drink alcohol, limit how much you have: ? 0-1 drink a day for women. ? 0-2 drinks a day for men.  Be aware of how much alcohol is in your drink. In the [place] one drink equals one typical bottle of beer (12  oz), one-half glass of wine (5 oz), or one shot of hard liquor (1 oz).  Do not use any products that contain nicotine or tobacco, such as cigarettes and e-cigarettes. If you need help quitting, ask your health care provider. Summary  Having a healthy lifestyle and getting preventive care can help to protect your health and wellness after age 63.  Screening and testing are the best way to find a health problem early and help you avoid having a fall. Early diagnosis and treatment give you the best chance for managing medical conditions that are more common for people who are older than age 63.  Falls are a major cause of broken bones and head injuries in people who are older than age 63. Take precautions to prevent a fall at home.  Work with your health care provider to learn what changes you can make to improve your health and wellness and to prevent falls. This information is not intended to replace advice given to you by your health care provider. Make sure you discuss any questions you have with your health care provider. Document Revised: 09/06/2018 Document Reviewed: 03/29/2017 Elsevier Patient Education  2020 Elsevier Inc.  

## 2019-07-27 LAB — CBC WITH DIFFERENTIAL/PLATELET
Basophils Absolute: 0.1 10*3/uL (ref 0.0–0.2)
Basos: 1 %
EOS (ABSOLUTE): 0.3 10*3/uL (ref 0.0–0.4)
Eos: 3 %
Hematocrit: 40.8 % (ref 37.5–51.0)
Hemoglobin: 13.7 g/dL (ref 13.0–17.7)
Immature Grans (Abs): 0 10*3/uL (ref 0.0–0.1)
Immature Granulocytes: 0 %
Lymphocytes Absolute: 2.5 10*3/uL (ref 0.7–3.1)
Lymphs: 27 %
MCH: 30.2 pg (ref 26.6–33.0)
MCHC: 33.6 g/dL (ref 31.5–35.7)
MCV: 90 fL (ref 79–97)
Monocytes Absolute: 1.1 10*3/uL — ABNORMAL HIGH (ref 0.1–0.9)
Monocytes: 12 %
Neutrophils Absolute: 5.2 10*3/uL (ref 1.4–7.0)
Neutrophils: 57 %
Platelets: 223 10*3/uL (ref 150–450)
RBC: 4.53 x10E6/uL (ref 4.14–5.80)
RDW: 12.6 % (ref 11.6–15.4)
WBC: 9.2 10*3/uL (ref 3.4–10.8)

## 2019-07-27 LAB — LIPID PANEL
Chol/HDL Ratio: 2 ratio (ref 0.0–5.0)
Cholesterol, Total: 95 mg/dL — ABNORMAL LOW (ref 100–199)
HDL: 48 mg/dL (ref >39)
LDL Chol Calc (NIH): 34 mg/dL (ref 0–99)
Triglycerides: 57 mg/dL (ref 0–149)
VLDL Cholesterol Cal: 13 mg/dL (ref 5–40)

## 2019-07-27 LAB — COMPREHENSIVE METABOLIC PANEL
ALT: 25 IU/L (ref 0–44)
AST: 22 IU/L (ref 0–40)
Albumin/Globulin Ratio: 1.8 (ref 1.2–2.2)
Albumin: 4.2 g/dL (ref 3.8–4.8)
Alkaline Phosphatase: 70 IU/L (ref 39–117)
BUN/Creatinine Ratio: 23 (ref 10–24)
BUN: 21 mg/dL (ref 8–27)
Bilirubin Total: 0.3 mg/dL (ref 0.0–1.2)
CO2: 20 mmol/L (ref 20–29)
Calcium: 9.2 mg/dL (ref 8.6–10.2)
Chloride: 105 mmol/L (ref 96–106)
Creatinine, Ser: 0.92 mg/dL (ref 0.76–1.27)
GFR calc Af Amer: 102 mL/min/{1.73_m2} (ref >59)
GFR calc non Af Amer: 88 mL/min/{1.73_m2} (ref >59)
Globulin, Total: 2.3 g/dL (ref 1.5–4.5)
Glucose: 100 mg/dL — ABNORMAL HIGH (ref 65–99)
Potassium: 4.2 mmol/L (ref 3.5–5.2)
Sodium: 139 mmol/L (ref 134–144)
Total Protein: 6.5 g/dL (ref 6.0–8.5)

## 2019-07-27 LAB — HEMOGLOBIN A1C
Est. average glucose Bld gHb Est-mCnc: 117 mg/dL
Hgb A1c MFr Bld: 5.7 % — ABNORMAL HIGH (ref 4.8–5.6)

## 2019-07-27 LAB — TSH: TSH: 0.825 u[IU]/mL (ref 0.450–4.500)

## 2020-04-16 ENCOUNTER — Other Ambulatory Visit: Payer: Self-pay | Admitting: *Deleted

## 2020-04-16 DIAGNOSIS — Z87891 Personal history of nicotine dependence: Secondary | ICD-10-CM

## 2020-04-16 DIAGNOSIS — Z122 Encounter for screening for malignant neoplasm of respiratory organs: Secondary | ICD-10-CM

## 2020-04-16 NOTE — Progress Notes (Signed)
Contacted and scheduled for lung screening scan. Patient is a current smoker, 32.75 pack year history. He has new Administrator Member ID # [id number].

## 2020-05-07 ENCOUNTER — Ambulatory Visit
Admission: RE | Admit: 2020-05-07 | Discharge: 2020-05-07 | Disposition: A | Discharge status: Home / Self Care | Payer: 59 | Source: Ambulatory Visit | Attending: Nurse Practitioner | Admitting: Nurse Practitioner

## 2020-05-07 ENCOUNTER — Other Ambulatory Visit: Payer: Self-pay

## 2020-05-07 DIAGNOSIS — Z87891 Personal history of nicotine dependence: Secondary | ICD-10-CM | POA: Insufficient documentation

## 2020-05-07 DIAGNOSIS — Z122 Encounter for screening for malignant neoplasm of respiratory organs: Secondary | ICD-10-CM

## 2020-05-12 ENCOUNTER — Encounter: Payer: Self-pay | Admitting: *Deleted

## 2020-06-16 NOTE — Progress Notes (Unsigned)
  {  This patient's chart is due for periodic physician review. Please check 'Cosign Required' and forward to your supervising physician.:1}  Established patient visit   Patient: Xavier Harrison   DOB: 1955/06/17   65 y.o. Male  MRN: [id number] Visit Date: 06/17/2020  Today's healthcare provider: Trinna Post, PA-C   No chief complaint on file.  Subjective    Back Pain      {Show patient history (optional):23778::" "}   Medications: Outpatient Medications Prior to Visit  Medication Sig  . aspirin 81 MG chewable tablet Chew 81 mg every evening by mouth.  Marland Kitchen atorvastatin (LIPITOR) 80 MG tablet Take 1 tablet (80 mg total) by mouth daily at 6 PM.  . calcium carbonate (TUMS - DOSED IN MG ELEMENTAL CALCIUM) 500 MG chewable tablet Chew 1 tablet daily as needed by mouth for indigestion or heartburn.  . diphenhydramine-acetaminophen (TYLENOL PM) 25-500 MG TABS tablet Take 1 tablet at bedtime as needed by mouth (sleep).  Marland Kitchen ibuprofen (ADVIL,MOTRIN) 200 MG tablet Take 400-600 mg 2 (two) times daily as needed by mouth for moderate pain.  . metoprolol succinate (TOPROL-XL) 25 MG 24 hr tablet Take 25 mg every evening by mouth.   . sodium chloride (OCEAN) 0.65 % SOLN nasal spray Place 1 spray as needed into both nostrils for congestion.   No facility-administered medications prior to visit.    Review of Systems  Musculoskeletal: Positive for back pain.    {Labs  Heme  Chem  Endocrine  Serology  Results Review (optional):23779::" "}   Objective    There were no vitals taken for this visit. {Show previous vital signs (optional):23777::" "}   Physical Exam  ***  No results found for any visits on 06/17/20.  Assessment & Plan     ***  No follow-ups on file.      {provider attestation***:1}   Paulene Floor  [facility] [phone] (phone) [phone] (fax)  [place]

## 2020-06-17 ENCOUNTER — Encounter: Payer: Self-pay | Admitting: Physician Assistant

## 2020-06-17 ENCOUNTER — Telehealth (INDEPENDENT_AMBULATORY_CARE_PROVIDER_SITE_OTHER): Payer: 59 | Admitting: Physician Assistant

## 2020-06-17 ENCOUNTER — Emergency Department: Admission: EM | Admit: 2020-06-17 | Discharge: 2020-06-17 | Discharge status: Against Medical Advice | Payer: 59

## 2020-06-17 DIAGNOSIS — B029 Zoster without complications: Secondary | ICD-10-CM | POA: Diagnosis not present

## 2020-06-17 MED ORDER — HYDROCODONE-ACETAMINOPHEN 5-325 MG PO TABS: 1.0000 | ORAL_TABLET | Freq: Two times a day (BID) | ORAL | 0 refills | Status: DC

## 2020-06-17 MED ORDER — VALACYCLOVIR HCL 1 G PO TABS
1000.0000 mg | ORAL_TABLET | Freq: Three times a day (TID) | ORAL | 0 refills | Status: AC
Start: 2020-06-17 — End: 2020-06-24

## 2020-06-17 NOTE — ED Triage Notes (Addendum)
Pt in with co left lower back pain that started a week ago. States no injury to back, now radiates to left abd. Denies any hx of the same, no n.v.d or fever. Pt has rash to left flank and red lower back. Denies any hx of shingles.

## 2020-06-17 NOTE — Progress Notes (Signed)
MyChart Video Visit    Virtual Visit via Video Note   This visit type was conducted due to national recommendations for restrictions regarding the COVID-19 Pandemic (e.g. social distancing) in an effort to limit this patient's exposure and mitigate transmission in our community. This patient is at least at moderate risk for complications without adequate follow up. This format is felt to be most appropriate for this patient at this time. Physical exam was limited by quality of the video and audio technology used for the visit.   Patient location: Home Provider location: Office  I discussed the limitations of evaluation and management by telemedicine and the availability of in person appointments. The patient expressed understanding and agreed to proceed.  Patient: Xavier Harrison   DOB: 02-26-1956   65 y.o. Male  MRN: [id number] Visit Date: 06/17/2020  Today's healthcare provider: Trinna Post, PA-C   Chief Complaint  Patient presents with  . Rash  I,Natanel Snavely M Tashina [employer],acting as a scribe for [employer], PA-C.,have documented all relevant documentation on the behalf of Trinna Post, PA-C,as directed by  Trinna Post, PA-C while in the presence of Trinna Post, PA-C.  Subjective    Rash This is a new problem. The current episode started in the past 7 days. The problem is unchanged. The affected locations include the back. The rash is characterized by blistering, pain and redness. He was exposed to nothing. Pertinent negatives include no cough, fatigue, fever, shortness of breath or sore throat.    Patient reports having left sided flank pain early on. Patient was concerned he may have a kidney stone or a pulled muscle . However, shortly after he had a blistery rash emerge on his left side.     Medications: Outpatient Medications Prior to Visit  Medication Sig  . aspirin 81 MG chewable tablet Chew 81 mg every evening by mouth.  Marland Kitchen atorvastatin (LIPITOR) 80 MG tablet  Take 1 tablet (80 mg total) by mouth daily at 6 PM.  . calcium carbonate (TUMS - DOSED IN MG ELEMENTAL CALCIUM) 500 MG chewable tablet Chew 1 tablet daily as needed by mouth for indigestion or heartburn.  . diphenhydramine-acetaminophen (TYLENOL PM) 25-500 MG TABS tablet Take 1 tablet at bedtime as needed by mouth (sleep).  Marland Kitchen ibuprofen (ADVIL,MOTRIN) 200 MG tablet Take 400-600 mg 2 (two) times daily as needed by mouth for moderate pain.  . metoprolol succinate (TOPROL-XL) 25 MG 24 hr tablet Take 25 mg every evening by mouth.   . sodium chloride (OCEAN) 0.65 % SOLN nasal spray Place 1 spray as needed into both nostrils for congestion.   No facility-administered medications prior to visit.    Review of Systems  Constitutional: Negative for fatigue and fever.  HENT: Negative for sore throat.   Respiratory: Negative for cough and shortness of breath.   Skin: Positive for rash.      Objective    There were no vitals taken for this visit.   Physical Exam Constitutional:      Appearance: Normal appearance.  Pulmonary:     Effort: Pulmonary effort is normal. No respiratory distress.  Skin:    General: Skin is warm and dry.     Findings: Rash present.  Neurological:     Mental Status: He is alert.  Psychiatric:        Mood and Affect: Mood normal.        Behavior: Behavior normal.      Media Information  Document Information  Patient Upload:  Patient Entered Attachment  [id number]  06/17/2020  Attached To:  Patient Message on 06/17/20 with Trinna Post, PA-C   Source Information  Mychart, Generic      Assessment & Plan    1. Herpes zoster without complication  - valACYclovir (VALTREX) 1000 MG tablet; Take 1 tablet (1,000 mg total) by mouth 3 (three) times daily for 7 days.  Dispense: 21 tablet; Refill: 0 - HYDROcodone-acetaminophen (NORCO/VICODIN) 5-325 MG tablet; Take 1 tablet by mouth every 12 (twelve) hours for 5 days.  Dispense: 10 tablet;  Refill: 0   Return if symptoms worsen or fail to improve.     I discussed the assessment and treatment plan with the patient. The patient was provided an opportunity to ask questions and all were answered. The patient agreed with the plan and demonstrated an understanding of the instructions.   The patient was advised to call back or seek an in-person evaluation if the symptoms worsen or if the condition fails to improve as anticipated.   ITrinna Post, PA-C, have reviewed all documentation for this visit. The documentation on 06/17/20 for the exam, diagnosis, procedures, and orders are all accurate and complete.  The entirety of the information documented in the History of Present Illness, Review of Systems and Physical Exam were personally obtained by me. Portions of this information were initially documented by [facility] and reviewed by me for thoroughness and accuracy.    Paulene Floor [facility] [phone] (phone) [phone] (fax)  [place]

## 2020-06-18 ENCOUNTER — Encounter: Payer: Self-pay | Admitting: Physician Assistant

## 2020-06-19 ENCOUNTER — Telehealth (INDEPENDENT_AMBULATORY_CARE_PROVIDER_SITE_OTHER): Payer: 59 | Admitting: Physician Assistant

## 2020-06-19 ENCOUNTER — Ambulatory Visit: Payer: Self-pay

## 2020-06-19 ENCOUNTER — Encounter: Payer: Self-pay | Admitting: Physician Assistant

## 2020-06-19 DIAGNOSIS — B028 Zoster with other complications: Secondary | ICD-10-CM | POA: Diagnosis not present

## 2020-06-19 MED ORDER — HYDROCODONE-ACETAMINOPHEN 5-325 MG PO TABS: 1.0000 | ORAL_TABLET | Freq: Four times a day (QID) | ORAL | 0 refills | Status: DC | PRN

## 2020-06-19 MED ORDER — GABAPENTIN 300 MG PO CAPS: 300.0000 mg | ORAL_CAPSULE | Freq: Three times a day (TID) | ORAL | 0 refills | Status: DC

## 2020-06-19 NOTE — Telephone Encounter (Signed)
Can we let Xavier Harrison know I sent in some Gabapentin for his shingles pain? He can take this in addition to the hydrocodone, can make him drowsy so be careful driving. We did talk about adding this if pain was not controlled at our prior visit so if he wants to cancel his visit today that is OK with me. If he would still like the visit to go over anything, that's OK too.

## 2020-06-19 NOTE — Telephone Encounter (Signed)
Patient called after Dx of shingles. He states they start left side back and travel over the left kidney area to his navel  The rash is red blisters. He is calling for follow up because he has severe pain rated at ten.  He states it feel as if someone has punched him in the ribs. He has taken the pain medication as prescribed but states the pain is unbearable between doses. Per protocol appointment scheduled for video visit today. Care advice read to patient. He verbalized understanding of all information.  Reason for Disposition . SEVERE pain (e.g., excruciating)  Answer Assessment - Initial Assessment Questions 1. APPEARANCE of RASH: "Describe the rash."      blistered 2. LOCATION: "Where is the rash located?"     Left side back shoots to navel 3. ONSET: "When did the rash start?"      friday 4. ITCHING: "Does the rash itch?" If Yes, ask: "How bad is the itch?"  (Scale 1-10; or mild, moderate, severe)     Itchy but mild 5. PAIN: "Does the rash hurt?" If Yes, ask: "How bad is the pain?"  (Scale 1-10; or mild, moderate, severe)    10 throbbs 6. OTHER SYMPTOMS: "Do you have any other symptoms?" (e.g., fever)    none 7. PREGNANCY: "Is there any chance you are pregnant?" "When was your last menstrual period?"    N/A  Protocols used: [facility]

## 2020-06-19 NOTE — Progress Notes (Signed)
MyChart Video Visit    Virtual Visit via Video Note   This visit type was conducted due to national recommendations for restrictions regarding the COVID-19 Pandemic (e.g. social distancing) in an effort to limit this patient's exposure and mitigate transmission in our community. This patient is at least at moderate risk for complications without adequate follow up. This format is felt to be most appropriate for this patient at this time. Physical exam was limited by quality of the video and audio technology used for the visit.   Patient location: Home Provider location: Office   I discussed the limitations of evaluation and management by telemedicine and the availability of in person appointments. The patient expressed understanding and agreed to proceed.  Patient: Xavier Harrison   DOB: 09/15/1955   65 y.o. Male  MRN: [id number] Visit Date: 06/19/2020  Today's healthcare provider: Trinna Post, PA-C   Chief Complaint  Patient presents with  . Follow-up   Subjective    HPI  Follow up for shingles  The patient was last seen for this 2 days ago. Changes made at last visit include start Valtrex and Hydrocodone. He was prescribed hydrocodone 5-325 mg BID x 5 days but he has been taking this medication every 3 hours due to severe pain.   He reports excellent compliance with treatment.  He feels that condition is Unchanged. Patient reports sharp pain more around naval area on left side. Reports pain is 10/10. He is not having side effects. Denies fevers, chills, nausea, vomiting.  -----------------------------------------------------------------------------------------    Patient Active Problem List   Diagnosis Date Noted  . S/P drug eluting coronary stent placement 04/24/2017  . Personal history of tobacco use, presenting hazards to health 03/29/2017  . Angina pectoris, unstable ([place]) 03/20/2017  . Tobacco abuse 03/20/2017   Social History   Tobacco Use  . Smoking  status: Current Every Day Smoker    Packs/day: 0.75    Years: 42.75    Pack years: 32.06    Types: Cigarettes  . Smokeless tobacco: Never Used  Vaping Use  . Vaping Use: Never used  Substance Use Topics  . Alcohol use: Yes    Alcohol/week: 1.0 standard drink    Types: 1 Glasses of wine per week    Comment: rare  . Drug use: No   No Known Allergies    Medications: Outpatient Medications Prior to Visit  Medication Sig  . aspirin 81 MG chewable tablet Chew 81 mg every evening by mouth.  Marland Kitchen atorvastatin (LIPITOR) 80 MG tablet Take 1 tablet (80 mg total) by mouth daily at 6 PM.  . calcium carbonate (TUMS - DOSED IN MG ELEMENTAL CALCIUM) 500 MG chewable tablet Chew 1 tablet daily as needed by mouth for indigestion or heartburn.  . diphenhydramine-acetaminophen (TYLENOL PM) 25-500 MG TABS tablet Take 1 tablet at bedtime as needed by mouth (sleep).  . gabapentin (NEURONTIN) 300 MG capsule Take 1 capsule (300 mg total) by mouth 3 (three) times daily.  Marland Kitchen ibuprofen (ADVIL,MOTRIN) 200 MG tablet Take 400-600 mg 2 (two) times daily as needed by mouth for moderate pain.  . metoprolol succinate (TOPROL-XL) 25 MG 24 hr tablet Take 25 mg every evening by mouth.   . sodium chloride (OCEAN) 0.65 % SOLN nasal spray Place 1 spray as needed into both nostrils for congestion.  . valACYclovir (VALTREX) 1000 MG tablet Take 1 tablet (1,000 mg total) by mouth 3 (three) times daily for 7 days.  . [DISCONTINUED] HYDROcodone-acetaminophen (NORCO/VICODIN)  5-325 MG tablet Take 1 tablet by mouth every 12 (twelve) hours for 5 days.   No facility-administered medications prior to visit.    Review of Systems  Constitutional: Positive for chills. Negative for fever.  Respiratory: Negative for cough and shortness of breath.   Cardiovascular: Negative for chest pain and palpitations.  Skin: Positive for rash.    Last CBC Lab Results  Component Value Date   WBC 9.2 07/26/2019   HGB 13.7 07/26/2019   HCT 40.8  07/26/2019   MCV 90 07/26/2019   MCH 30.2 07/26/2019   RDW 12.6 07/26/2019   PLT 223 07/26/2019      Objective    There were no vitals taken for this visit. BP Readings from Last 3 Encounters:  07/26/19 114/72  07/19/17 (!) 142/84  04/25/17 122/81   Wt Readings from Last 3 Encounters:  05/07/20 144 lb (65.3 kg)  07/26/19 144 lb 12.8 oz (65.7 kg)  04/24/19 140 lb (63.5 kg)      Physical Exam Constitutional:      Appearance: Normal appearance.  Pulmonary:     Effort: Pulmonary effort is normal. No respiratory distress.  Neurological:     Mental Status: He is alert.  Psychiatric:        Mood and Affect: Mood normal.        Behavior: Behavior normal.        Assessment & Plan     1. Herpes zoster with other complication  Acute shingles, worsening since previous visit despite treatment. Have counseled that hydrococodone 5-325 mg every three hours is too frequent of a dosing. We will try below Q6H PRN and add gabapentin 300 mg TID. If the pain is severe, he may increase hydrocodone to every 4 hours but he has been warned to be careful of sedation between hydrocodone and gabapentin.   - HYDROcodone-acetaminophen (NORCO/VICODIN) 5-325 MG tablet; Take 1 tablet by mouth every 6 (six) hours as needed for up to 5 days for moderate pain.  Dispense: 20 tablet; Refill: 0   No follow-ups on file.     I discussed the assessment and treatment plan with the patient. The patient was provided an opportunity to ask questions and all were answered. The patient agreed with the plan and demonstrated an understanding of the instructions.   The patient was advised to call back or seek an in-person evaluation if the symptoms worsen or if the condition fails to improve as anticipated.  ITrinna Post, PA-C, have reviewed all documentation for this visit. The documentation on 06/19/20 for the exam, diagnosis, procedures, and orders are all accurate and complete.  The entirety of the  information documented in the History of Present Illness, Review of Systems and Physical Exam were personally obtained by me. Portions of this information were initially documented by Lynford Humphrey, CMA and reviewed by me for thoroughness and accuracy.    Paulene Floor [facility] [phone] (phone) [phone] (fax)  [place]

## 2020-06-24 ENCOUNTER — Telehealth: Payer: Self-pay | Admitting: Physician Assistant

## 2020-06-24 ENCOUNTER — Encounter: Payer: Self-pay | Admitting: Physician Assistant

## 2020-06-24 DIAGNOSIS — B028 Zoster with other complications: Secondary | ICD-10-CM

## 2020-06-24 MED ORDER — HYDROCODONE-ACETAMINOPHEN 5-325 MG PO TABS
1.0000 | ORAL_TABLET | Freq: Three times a day (TID) | ORAL | 0 refills | Status: DC
Start: 2020-06-24 — End: 2020-06-29

## 2020-06-29 ENCOUNTER — Encounter: Payer: Self-pay | Admitting: Physician Assistant

## 2020-06-29 DIAGNOSIS — B028 Zoster with other complications: Secondary | ICD-10-CM

## 2020-06-29 MED ORDER — HYDROCODONE-ACETAMINOPHEN 5-325 MG PO TABS: 1.0000 | ORAL_TABLET | Freq: Three times a day (TID) | ORAL | 0 refills | Status: AC

## 2020-07-07 ENCOUNTER — Ambulatory Visit: Payer: Self-pay | Admitting: Physician Assistant

## 2020-07-08 ENCOUNTER — Ambulatory Visit (INDEPENDENT_AMBULATORY_CARE_PROVIDER_SITE_OTHER): Payer: 59 | Admitting: Physician Assistant

## 2020-07-08 ENCOUNTER — Other Ambulatory Visit: Payer: Self-pay

## 2020-07-08 VITALS — BP 134/83 | HR 72 | Temp 98.1°F | Wt 151.6 lb

## 2020-07-08 DIAGNOSIS — B0229 Other postherpetic nervous system involvement: Secondary | ICD-10-CM | POA: Diagnosis not present

## 2020-07-08 MED ORDER — CAPSAICIN 0.035 % EX CREA: TOPICAL_CREAM | CUTANEOUS | 0 refills | Status: DC

## 2020-07-08 NOTE — Patient Instructions (Signed)
Shingles  Shingles is an infection. It gives you a painful skin rash and blisters that have fluid in them. Shingles is caused by the same germ (virus) that causes chickenpox. Shingles only happens in people who:  Have had chickenpox.  Have been given a shot of medicine (vaccine) to protect against chickenpox. Shingles is rare in this group. The first symptoms of shingles may be itching, tingling, or pain in an area on your skin. A rash will show on your skin a few days or weeks later. The rash is likely to be on one side of your body. The rash usually has a shape like a belt or a band. Over time, the rash turns into fluid-filled blisters. The blisters will break open, change into scabs, and dry up. Medicines may:  Help with pain and itching.  Help you get better sooner.  Help to prevent long-term problems. Follow these instructions at home: Medicines  Take over-the-counter and prescription medicines only as told by your doctor.  Put on an anti-itch cream or numbing cream where you have a rash, blisters, or scabs. Do this as told by your doctor. Helping with itching and discomfort  Put cold, wet cloths (cold compresses) on the area of the rash or blisters as told by your doctor.  Cool baths can help you feel better. Try adding baking soda or dry oatmeal to the water to lessen itching. Do not bathe in hot water.   Blister and rash care  Keep your rash covered with a loose bandage (dressing).  Wear loose clothing that does not rub on your rash.  Keep your rash and blisters clean. To do this, wash the area with mild soap and cool water as told by your doctor.  Check your rash every day for signs of infection. Check for: ? More redness, swelling, or pain. ? Fluid or blood. ? Warmth. ? Pus or a bad smell.  Do not scratch your rash. Do not pick at your blisters. To help you to not scratch: ? Keep your fingernails clean and cut short. ? Wear gloves or mittens when you sleep, if  scratching is a problem. General instructions  Rest as told by your doctor.  Keep all follow-up visits as told by your doctor. This is important.  Wash your hands often with soap and water. If soap and water are not available, use hand sanitizer. Doing this lowers your chance of getting a skin infection caused by germs (bacteria).  Your infection can cause chickenpox in people who have never had chickenpox or never got a shot of chickenpox vaccine. If you have blisters that did not change into scabs yet, try not to touch other people or be around other people, especially: ? Babies. ? Pregnant women. ? Children who have areas of red, itchy, or rough skin (eczema). ? Very old people who have transplants. ? People who have a long-term (chronic) sickness, like cancer or AIDS. Contact a doctor if:  Your pain does not get better with medicine.  Your pain does not get better after the rash heals.  You have any signs of infection in the rash area. These signs include: ? More redness, swelling, or pain around the rash. ? Fluid or blood coming from the rash. ? The rash area feeling warm to the touch. ? Pus or a bad smell coming from the rash. Get help right away if:  The rash is on your face or nose.  You have pain in your face or pain  by your eye.  You lose feeling on one side of your face.  You have trouble seeing.  You have ear pain, or you have ringing in your ear.  You have a loss of taste.  Your condition gets worse. Summary  Shingles gives you a painful skin rash and blisters that have fluid in them.  Shingles is an infection. It is caused by the same germ (virus) that causes chickenpox.  Keep your rash covered with a loose bandage (dressing). Wear loose clothing that does not rub on your rash.  If you have blisters that did not change into scabs yet, try not to touch other people or be around people. This information is not intended to replace advice given to you by  your health care provider. Make sure you discuss any questions you have with your health care provider. Document Revised: 09/07/2018 Document Reviewed: 01/18/2017 Elsevier Patient Education  2021 [employer].

## 2020-07-08 NOTE — Progress Notes (Signed)
Established patient visit   Patient: Xavier Harrison   DOB: [birth year]   65 y.o. Male  MRN: [id number] Visit Date: 07/08/2020  Today's healthcare provider: Trinna Post, PA-C   Chief Complaint  Patient presents with  . Follow-up  I,Adriana M Pollak,acting as a scribe for Trinna Post, PA-C.,have documented all relevant documentation on the behalf of Trinna Post, PA-C,as directed by  Trinna Post, PA-C while in the presence of Trinna Post, PA-C.  Subjective    HPI  Follow-up  Patient presents today for a follow-up for shingles to his left side and abdomen. He was first diagnosed on 06/08/2019. Treated with valtrex and narcotic pain medication. He reported persistent pain and had several refills of narcotic pain medication. He was also started on gabapentin. He reports they are painful and he have completed the treatment for the shingles. He reports continued pain despite these treatments.      Medications: Outpatient Medications Prior to Visit  Medication Sig  . aspirin 81 MG chewable tablet Chew 81 mg every evening by mouth.  Marland Kitchen atorvastatin (LIPITOR) 80 MG tablet Take 1 tablet (80 mg total) by mouth daily at 6 PM.  . calcium carbonate (TUMS - DOSED IN MG ELEMENTAL CALCIUM) 500 MG chewable tablet Chew 1 tablet daily as needed by mouth for indigestion or heartburn.  . diphenhydramine-acetaminophen (TYLENOL PM) 25-500 MG TABS tablet Take 1 tablet at bedtime as needed by mouth (sleep).  . gabapentin (NEURONTIN) 300 MG capsule Take 1 capsule (300 mg total) by mouth 3 (three) times daily.  Marland Kitchen ibuprofen (ADVIL,MOTRIN) 200 MG tablet Take 400-600 mg 2 (two) times daily as needed by mouth for moderate pain.  . metoprolol succinate (TOPROL-XL) 25 MG 24 hr tablet Take 25 mg every evening by mouth.   . sodium chloride (OCEAN) 0.65 % SOLN nasal spray Place 1 spray as needed into both nostrils for congestion.   No facility-administered medications prior to visit.    Review  of Systems     Objective    BP 134/83 (BP Location: Left Arm, Patient Position: Sitting, Cuff Size: Large)   Pulse 72   Temp 98.1 F (36.7 C) (Oral)   Wt 151 lb 9.6 oz (68.8 kg)   SpO2 100%   BMI 22.39 kg/m     Physical Exam Constitutional:      Appearance: Normal appearance.  Cardiovascular:     Rate and Rhythm: Normal rate.  Pulmonary:     Effort: Pulmonary effort is normal.  Skin:    General: Skin is warm and dry.       Neurological:     General: No focal deficit present.     Mental Status: He is alert and oriented to person, place, and time.  Psychiatric:        Mood and Affect: Mood normal.        Behavior: Behavior normal.       No results found for any visits on 07/08/20.  Assessment & Plan    1. HZV (herpes zoster virus) post herpetic neuralgia  Plan on increasing gabapentin to 600 mg three daily. If this is not effective, patient will MyChart message back and we will try amitriptyline at 10 mg before bed. We will also try capsaicin cream three times daily.    No follow-ups on file.      ITrinna Post, PA-C, have reviewed all documentation for this visit. The documentation on 07/08/20 for the  exam, diagnosis, procedures, and orders are all accurate and complete.  The entirety of the information documented in the History of Present Illness, Review of Systems and Physical Exam were personally obtained by me. Portions of this information were initially documented by [facility] and reviewed by me for thoroughness and accuracy.     Paulene Floor  [facility] [phone] (phone) [phone] (fax)  [place]

## 2021-01-22 ENCOUNTER — Other Ambulatory Visit: Payer: Self-pay

## 2021-01-22 ENCOUNTER — Encounter: Payer: Self-pay | Admitting: Family Medicine

## 2021-01-22 ENCOUNTER — Ambulatory Visit (INDEPENDENT_AMBULATORY_CARE_PROVIDER_SITE_OTHER): Payer: 59 | Admitting: Family Medicine

## 2021-01-22 VITALS — BP 123/83 | HR 57 | Temp 98.2°F | Ht 70.0 in | Wt 145.9 lb

## 2021-01-22 DIAGNOSIS — C44629 Squamous cell carcinoma of skin of left upper limb, including shoulder: Secondary | ICD-10-CM | POA: Insufficient documentation

## 2021-01-22 DIAGNOSIS — Z23 Encounter for immunization: Secondary | ICD-10-CM

## 2021-01-22 DIAGNOSIS — L989 Disorder of the skin and subcutaneous tissue, unspecified: Secondary | ICD-10-CM | POA: Diagnosis not present

## 2021-01-22 DIAGNOSIS — Z720 Tobacco use: Secondary | ICD-10-CM | POA: Diagnosis not present

## 2021-01-22 DIAGNOSIS — Z1211 Encounter for screening for malignant neoplasm of colon: Secondary | ICD-10-CM

## 2021-01-22 DIAGNOSIS — Z955 Presence of coronary angioplasty implant and graft: Secondary | ICD-10-CM

## 2021-01-22 NOTE — Assessment & Plan Note (Signed)
Currently taking ASA 81 every other day, d/t bleeding concerns

## 2021-01-22 NOTE — Assessment & Plan Note (Signed)
Continues to smoke 1/2 pk per day Refused assistance to quit

## 2021-01-22 NOTE — Assessment & Plan Note (Signed)
Due today for 2nd of 2

## 2021-01-22 NOTE — Assessment & Plan Note (Signed)
Small hard mass 3 mm x 4 mm x 0.2 mm No pain, bothersome now that the pt knows that it is there No discharge Reddish/maroon color

## 2021-01-22 NOTE — Progress Notes (Signed)
Established patient visit   Patient: Xavier Harrison   DOB: Oct 04, 1955   65 y.o. Male  MRN: [id number] Visit Date: 01/22/2021  Today's healthcare provider: Gwyneth Sprout, FNP   No chief complaint on file.  Subjective  -------------------------------------------------------------------------------------------------------------------- HPI  Place of left elbow that is rough and red. Patient first noticed a few days ago. Plays golf everyday but doesn't think its due to anything he does outside. No associated symptoms.  No change in size. No drainage.  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   atorvastatin (LIPITOR) 80 MG tablet Take 1 tablet (80 mg total) by mouth daily at 6 PM.   calcium carbonate (TUMS - DOSED IN MG ELEMENTAL CALCIUM) 500 MG chewable tablet Chew 1 tablet daily as needed by mouth for indigestion or heartburn.   Capsaicin 0.035 % CREA Apply to area three times a day.   diphenhydramine-acetaminophen (TYLENOL PM) 25-500 MG TABS tablet Take 1 tablet at bedtime as needed by mouth (sleep).   gabapentin (NEURONTIN) 300 MG capsule Take 1 capsule (300 mg total) by mouth 3 (three) times daily.   ibuprofen (ADVIL,MOTRIN) 200 MG tablet Take 400-600 mg 2 (two) times daily as needed by mouth for moderate pain.   metoprolol succinate (TOPROL-XL) 25 MG 24 hr tablet Take 25 mg every evening by  mouth.    sodium chloride (OCEAN) 0.65 % SOLN nasal spray Place 1 spray as needed into both nostrils for congestion.   No facility-administered medications prior to visit.    Review of Systems  Last CBC Lab Results  Component Value Date   WBC 9.2 07/26/2019   HGB 13.7 07/26/2019   HCT 40.8 07/26/2019   MCV 90 07/26/2019   MCH 30.2 07/26/2019   RDW 12.6 07/26/2019   PLT 223 0000000   Last metabolic panel Lab Results  Component Value Date   GLUCOSE 100 (H) 07/26/2019   NA 139 07/26/2019   K 4.2 07/26/2019   CL 105 07/26/2019   CO2 20 07/26/2019   BUN 21 07/26/2019   CREATININE 0.92 07/26/2019   GFRNONAA 88 07/26/2019   GFRAA 102 07/26/2019   CALCIUM 9.2 07/26/2019   PROT 6.5 07/26/2019   ALBUMIN 4.2 07/26/2019   LABGLOB 2.3 07/26/2019   AGRATIO 1.8 07/26/2019   BILITOT 0.3 07/26/2019   ALKPHOS 70 07/26/2019   AST 22 07/26/2019   ALT 25 07/26/2019   Last lipids Lab Results  Component Value Date   CHOL 95 (L) 07/26/2019   HDL 48 07/26/2019   LDLCALC 34 07/26/2019   TRIG 57 07/26/2019   CHOLHDL 2.0 07/26/2019   Last hemoglobin A1c Lab Results  Component Value Date   HGBA1C 5.7 (H) 07/26/2019   Last thyroid functions Lab Results  Component Value Date   TSH 0.825 07/26/2019      Objective  -------------------------------------------------------------------------------------------------------------------- BP  123/83 (BP Location: Left Arm, Patient Position: Sitting, Cuff Size: Normal)   Pulse (!) 57   Temp 98.2 F (36.8 C) (Oral)   Ht '5\' 10"'$  (1.778 m)   Wt 145 lb 14.4 oz (66.2 kg)   SpO2 98%   BMI 20.93 kg/m  BP Readings from Last 3 Encounters:  01/22/21 123/83  07/08/20 134/83  07/26/19 114/72   Wt Readings from Last 3 Encounters:  01/22/21 145 lb 14.4 oz (66.2 kg)  07/08/20 151 lb 9.6 oz (68.8 kg)  05/07/20 144 lb (65.3 kg)       Physical Exam Vitals and nursing note reviewed.  Constitutional:      Appearance: Normal appearance. He is  normal weight.  HENT:     Head: Normocephalic.  Cardiovascular:     Rate and Rhythm: Normal rate and regular rhythm.     Pulses: Normal pulses.     Heart sounds: Normal heart sounds. No murmur heard.   No friction rub. No gallop.  Pulmonary:     Effort: No respiratory distress.     Breath sounds: Normal breath sounds. No stridor. No wheezing, rhonchi or rales.  Chest:     Chest wall: No tenderness.  Abdominal:     General: Abdomen is flat. Bowel sounds are normal.     Palpations: Abdomen is soft.  Skin:    Capillary Refill: Capillary refill takes less than 2 seconds.  Neurological:     General: No focal deficit present.     Mental Status: He is alert and oriented to person, place, and time.  Psychiatric:        Mood and Affect: Mood normal.        Behavior: Behavior normal.        Thought Content: Thought content normal.      No results found for any visits on 01/22/21.  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    Potential SCC of L elbow- posterior Smoking history        Other   Tobacco abuse    Continues to smoke 1/2 pk per day Refused assistance to quit      S/P drug eluting coronary stent placement    Currently taking ASA 81 every other day, d/t bleeding concerns       Encounter for screening for malignant neoplasm of colon   Relevant Orders   Cologuard   Need for pneumococcal vaccination    Due today for 2nd of 2      Relevant Orders   Pneumococcal conjugate vaccine 20-valent (Prevnar 20) (Completed)    Need for shingles vaccine    First of two Knows first hand how bad shingles is- pain wise Repeat dose in 2-6 months       Relevant Orders   Varicella-zoster vaccine subcutaneous (Completed)     Return in about 3 months (around 04/24/2021) for nurse follow up, immunization.      Vonna Kotyk, FNP, have reviewed all documentation for this visit. The documentation on 01/22/21 for the exam, diagnosis, procedures, and orders are  all accurate and complete.    Gwyneth Sprout, [place] [phone] (phone) [phone] (fax)  [place]

## 2021-01-22 NOTE — Assessment & Plan Note (Signed)
First of two Knows first hand how bad shingles is- pain wise Repeat dose in 2-6 months

## 2021-01-22 NOTE — Assessment & Plan Note (Signed)
Potential SCC of L elbow- posterior Smoking history

## 2021-01-26 ENCOUNTER — Other Ambulatory Visit: Payer: Self-pay

## 2021-01-26 ENCOUNTER — Ambulatory Visit (INDEPENDENT_AMBULATORY_CARE_PROVIDER_SITE_OTHER): Payer: 59 | Admitting: Dermatology

## 2021-01-26 DIAGNOSIS — D229 Melanocytic nevi, unspecified: Secondary | ICD-10-CM | POA: Diagnosis not present

## 2021-01-26 DIAGNOSIS — Z1283 Encounter for screening for malignant neoplasm of skin: Secondary | ICD-10-CM | POA: Diagnosis not present

## 2021-01-26 DIAGNOSIS — L814 Other melanin hyperpigmentation: Secondary | ICD-10-CM | POA: Diagnosis not present

## 2021-01-26 DIAGNOSIS — D1800 Hemangioma unspecified site: Secondary | ICD-10-CM | POA: Diagnosis not present

## 2021-01-26 DIAGNOSIS — L820 Inflamed seborrheic keratosis: Secondary | ICD-10-CM

## 2021-01-26 DIAGNOSIS — L821 Other seborrheic keratosis: Secondary | ICD-10-CM

## 2021-01-26 NOTE — Patient Instructions (Addendum)
Cryotherapy Aftercare  Wash gently with soap and water everyday.   Apply Vaseline and Band-Aid daily until healed.   Seborrheic Keratosis  What causes seborrheic keratoses? Seborrheic keratoses are harmless, common skin growths that first appear during adult life.  As time goes by, more growths appear.  Some people may develop a large number of them.  Seborrheic keratoses appear on both covered and uncovered body parts.  They are not caused by sunlight.  The tendency to develop seborrheic keratoses can be inherited.  They vary in color from skin-colored to gray, brown, or even black.  They can be either smooth or have a rough, warty surface.   Seborrheic keratoses are superficial and look as if they were stuck on the skin.  Under the microscope this type of keratosis looks like layers upon layers of skin.  That is why at times the top layer may seem to fall off, but the rest of the growth remains and re-grows.    Treatment Seborrheic keratoses do not need to be treated, but can easily be removed in the office.  Seborrheic keratoses often cause symptoms when they rub on clothing or jewelry.  Lesions can be in the way of shaving.  If they become inflamed, they can cause itching, soreness, or burning.  Removal of a seborrheic keratosis can be accomplished by freezing, burning, or surgery. If any spot bleeds, scabs, or grows rapidly, please return to have it checked, as these can be an indication of a skin cancer.   If you have any questions or concerns for your doctor, please call our main line at [phone] and press option 4 to reach your doctor's medical assistant. If no one answers, please leave a voicemail as directed and we will return your call as soon as possible. Messages left after 4 pm will be answered the following business day.   You may also send us a message via MyChart. We typically respond to MyChart messages within 1-2 business days.  For prescription refills, please ask your  pharmacy to contact our office. Our fax number is [phone].  If you have an urgent issue when the clinic is closed that cannot wait until the next business day, you can page your doctor at the number below.    Please note that while we do our best to be available for urgent issues outside of office hours, we are not available 24/7.   If you have an urgent issue and are unable to reach us, you may choose to seek medical care at your doctor's office, retail clinic, urgent care center, or emergency room.  If you have a medical emergency, please immediately call 911 or go to the emergency department.  Pager Numbers  - Dr. Kowalski: [phone]  - Dr. Moye: [phone]  - Dr. Stewart: [phone]  In the event of inclement weather, please call our main line at [phone] for an update on the status of any delays or closures.  Dermatology Medication Tips: Please keep the boxes that topical medications come in in order to help keep track of the instructions about where and how to use these. Pharmacies typically print the medication instructions only on the boxes and not directly on the medication tubes.   If your medication is too expensive, please contact our office at [phone] option 4 or send us a message through MyChart.   We are unable to tell what your co-pay for medications will be in advance as this is different depending on your insurance coverage.   However, we may be able to find a substitute medication at lower cost or fill out paperwork to get insurance to cover a needed medication.   If a prior authorization is required to get your medication covered by your insurance company, please allow us 1-2 business days to complete this process.  Drug prices often vary depending on where the prescription is filled and some pharmacies may offer cheaper prices.  The website www.goodrx.com contains coupons for medications through different pharmacies. The prices here do not  account for what the cost may be with help from insurance (it may be cheaper with your insurance), but the website can give you the price if you did not use any insurance.  - You can print the associated coupon and take it with your prescription to the pharmacy.  - You may also stop by our office during regular business hours and pick up a GoodRx coupon card.  - If you need your prescription sent electronically to a different pharmacy, notify our office through [facility] MyChart or by phone at [phone] option 4.  

## 2021-01-26 NOTE — Progress Notes (Signed)
   New Patient Visit  Subjective  Xavier Harrison is a 65 y.o. male who presents for the following: Growth (Left elbow, patient noticed last week. Area gets hit.). Also UBSE today. Patient in the sun and plays golf a lot. No history of skin cancer.   Referral from Tally Joe, FNP at [facility].  The following portions of the chart were reviewed this encounter and updated as appropriate:       Review of Systems:  No other skin or systemic complaints except as noted in HPI or Assessment and Plan.  Objective  Well appearing patient in no apparent distress; mood and affect are within normal limits.  All skin waist up examined.  Left Elbow Erythematous keratotic or waxy stuck-on papule.   Assessment & Plan  Skin cancer screening performed today.  Actinic Damage - chronic, secondary to cumulative UV radiation exposure/sun exposure over time - diffuse scaly erythematous macules with underlying dyspigmentation - Recommend daily broad spectrum sunscreen SPF 30+ to sun-exposed areas, reapply every 2 hours as needed.  - Recommend staying in the shade or wearing long sleeves, sun glasses (UVA+UVB protection) and wide brim hats (4-inch brim around the entire circumference of the hat). - Call for new or changing lesions.  Lentigines - Scattered tan macules - Due to sun exposure - Benign-appering, observe - Recommend daily broad spectrum sunscreen SPF 30+ to sun-exposed areas, reapply every 2 hours as needed. - Call for any changes  Hemangiomas - Red papules - Discussed benign nature - Observe - Call for any changes  Melanocytic Nevi - Tan-brown and/or pink-flesh-colored symmetric macules and papules - Benign appearing on exam today - Observation - Call clinic for new or changing moles - Recommend daily use of broad spectrum spf 30+ sunscreen to sun-exposed areas.   Seborrheic Keratoses - Stuck-on, waxy, tan-brown papules and/or plaques  - Benign-appearing -  Discussed benign etiology and prognosis. - Observe - Call for any changes  Inflamed seborrheic keratosis Left Elbow  Destruction of lesion - Left Elbow  Destruction method: cryotherapy   Informed consent: discussed and consent obtained   Lesion destroyed using liquid nitrogen: Yes   Region frozen until ice ball extended beyond lesion: Yes   Outcome: patient tolerated procedure well with no complications   Post-procedure details: wound care instructions given   Additional details:  Prior to procedure, discussed risks of blister formation, small wound, skin dyspigmentation, or rare scar following cryotherapy. Recommend Vaseline ointment to treated areas while healing.   Return if symptoms worsen or fail to improve.  IJamesetta Orleans, CMA, am acting as scribe for Brendolyn Patty, MD .  Documentation: I have reviewed the above documentation for accuracy and completeness, and I agree with the above.  Brendolyn Patty MD

## 2021-02-09 LAB — COLOGUARD: Cologuard: NEGATIVE

## 2021-03-18 IMAGING — CT CT CHEST LUNG CANCER SCREENING LOW DOSE W/O CM
2 of 5 series · 15 of 40 positions shown, 18 images · non-contrast
Comparison: 04/24/2019

CLINICAL DATA: Thirty-two pack-year smoking history. Current
smoker.

EXAM:
CT CHEST WITHOUT CONTRAST LOW-DOSE FOR LUNG CANCER SCREENING
TECHNIQUE: Multidetector CT imaging of the chest was performed following the
standard protocol without IV contrast.

[Series 3: lung 1.00 · axial · 0.70mm/px · z∈[-1248,-905]mm · 12 of 379 slices shown, 15 images]
[im 18/379  mediastinal]
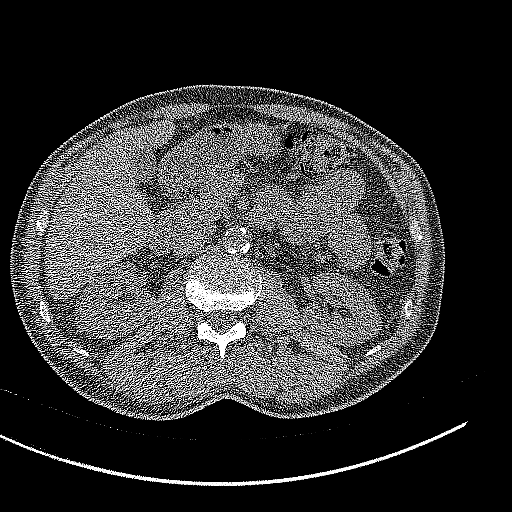
[im 18/379  lung]
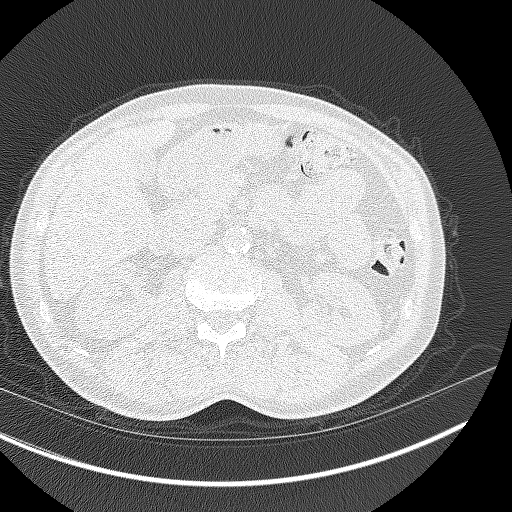
[im 52/379  lung]
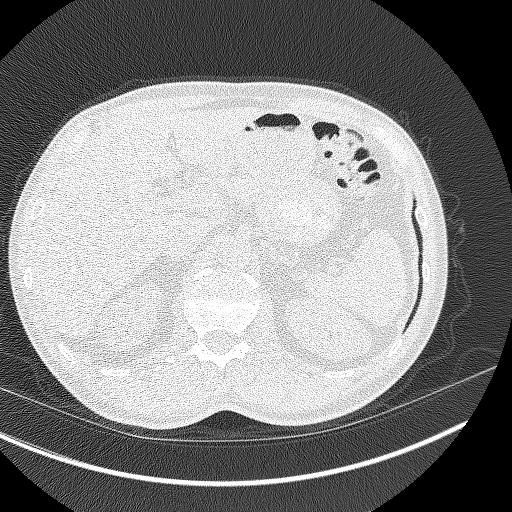
[im 86/379  lung]
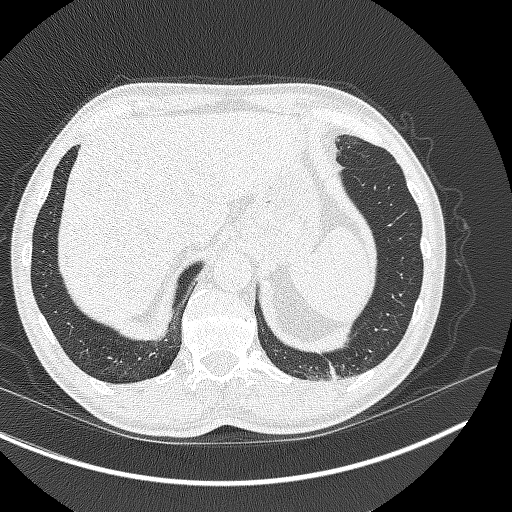
[im 121/379  lung]
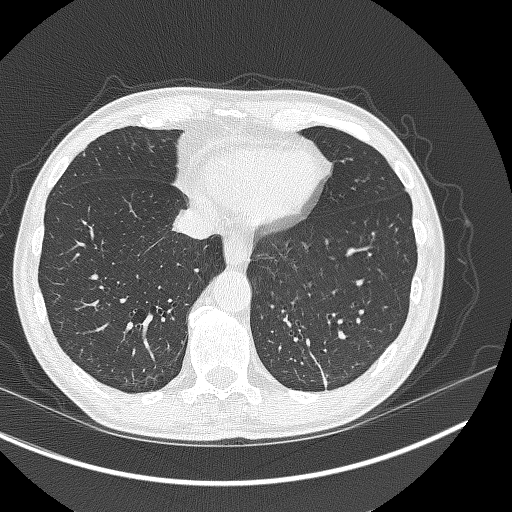
[im 138/379  mediastinal]
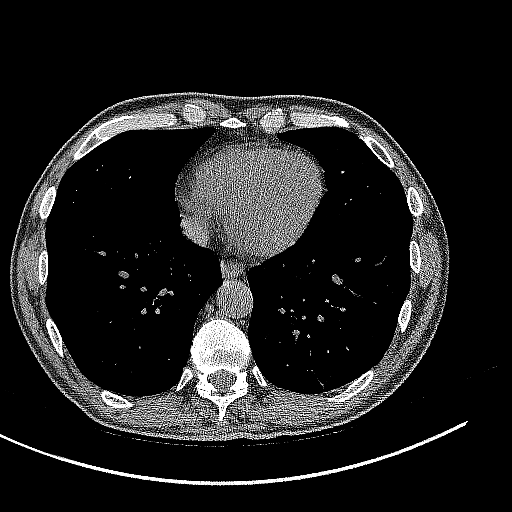
[im 138/379  lung]
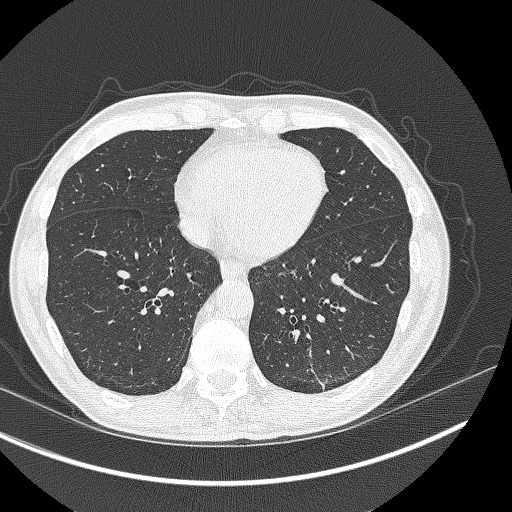
[im 172/379  lung]
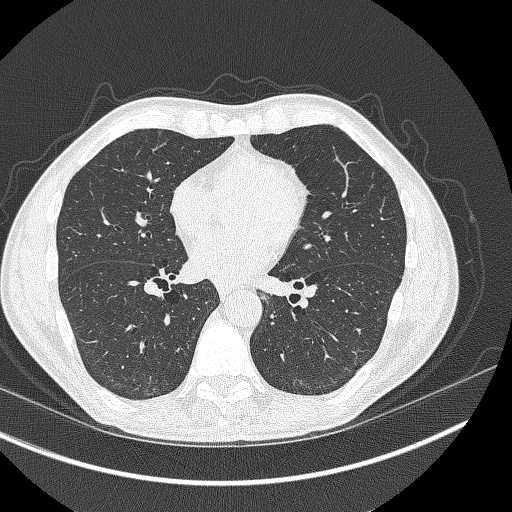
[im 207/379  lung]
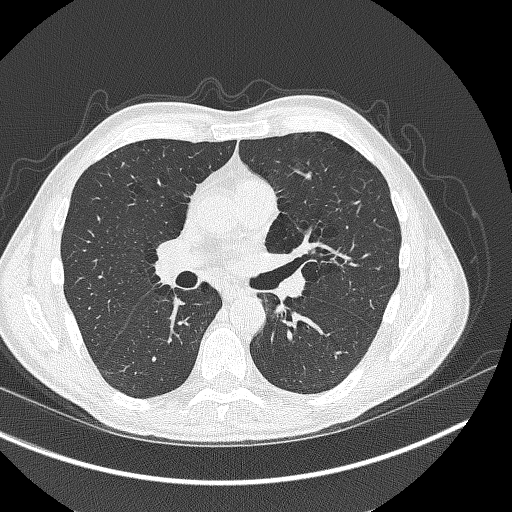
[im 241/379  lung]
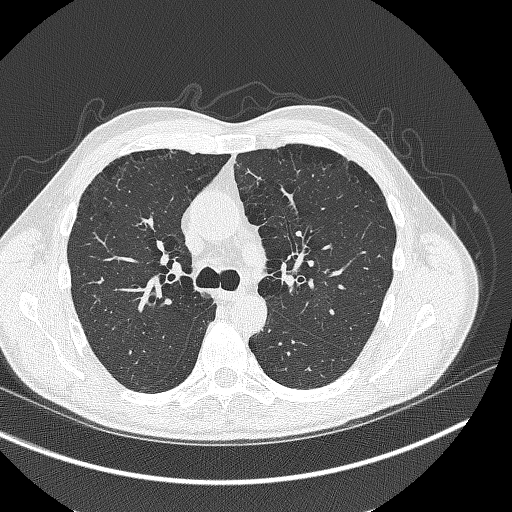
[im 258/379  mediastinal]
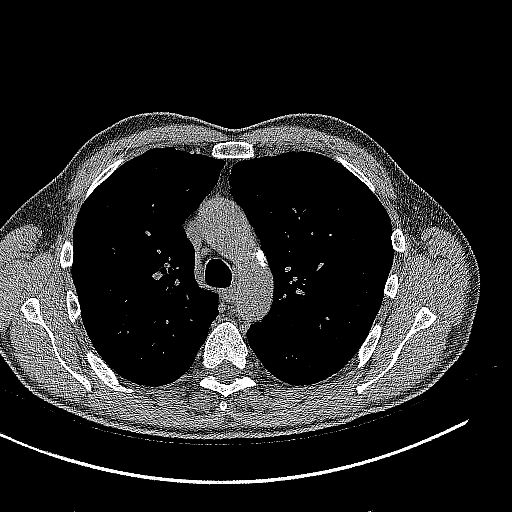
[im 258/379  lung]
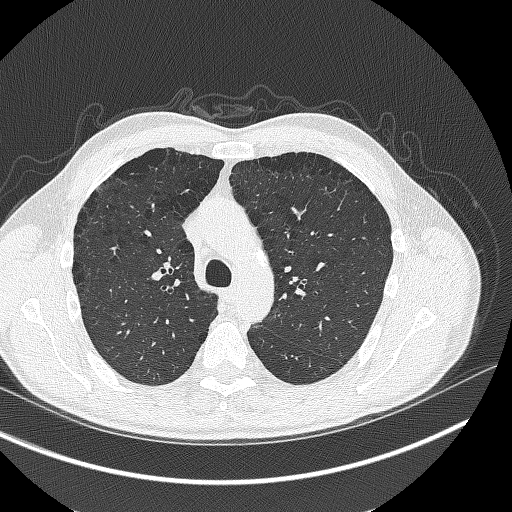
[im 293/379  lung]
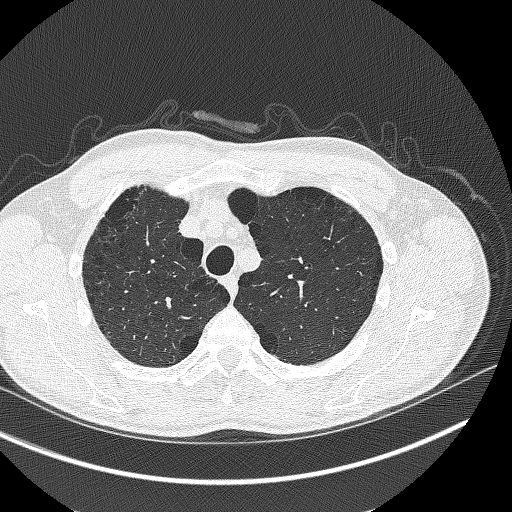
[im 327/379  lung]
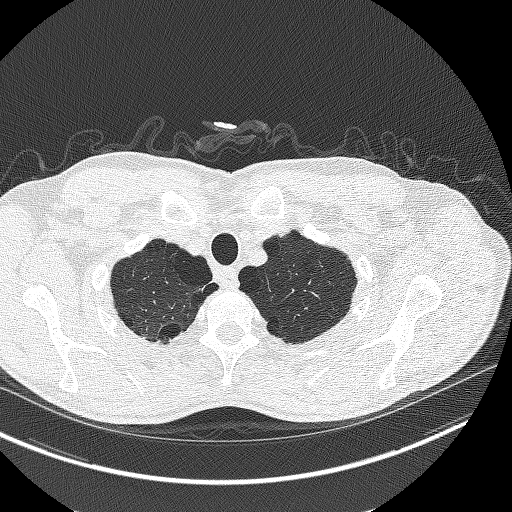
[im 361/379  lung]
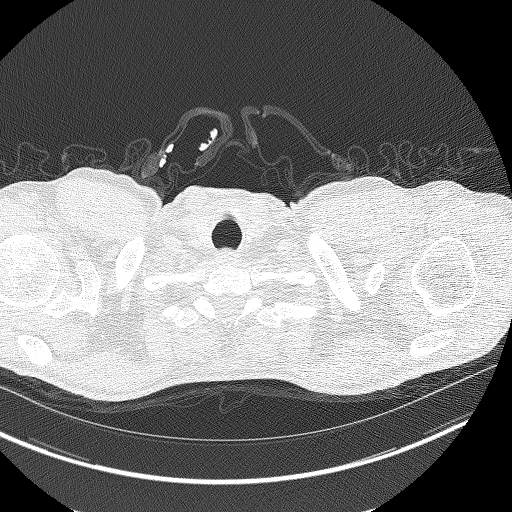

[Series 4: coronals lung 1.00 cor · coronal · 0.70mm/px · 3 of 323 slices shown]
[im 65/323  lung]
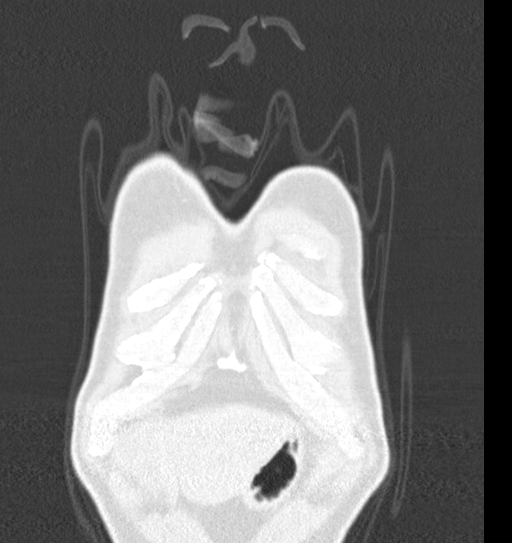
[im 129/323  lung]
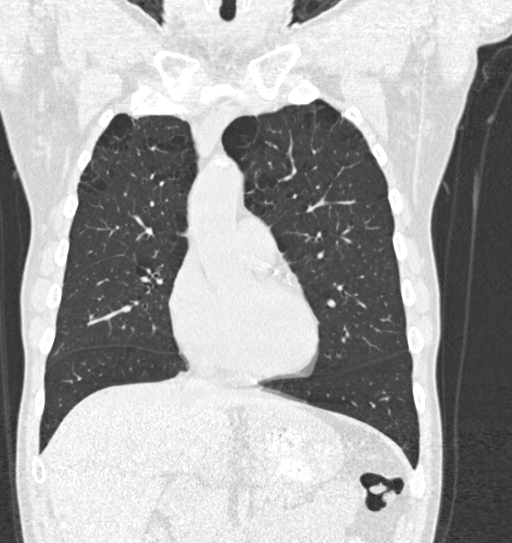
[im 194/323  lung]
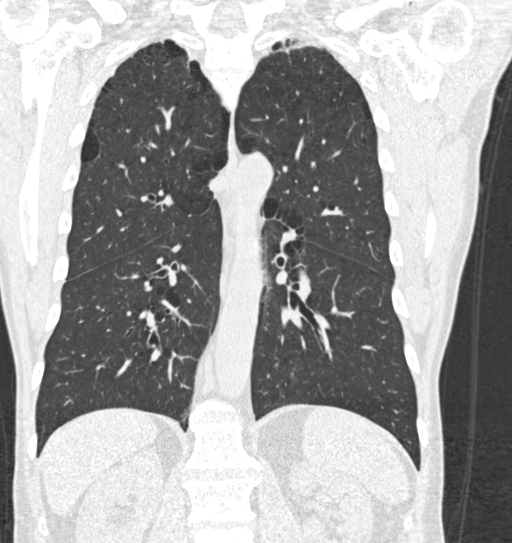

[15 of 40 positions shown; findings below may reference images not displayed]

FINDINGS: Cardiovascular: Aortic atherosclerosis. Tortuous thoracic aorta.
Normal heart size, without pericardial effusion. Multivessel
coronary artery atherosclerosis.

Mediastinum/Nodes: No mediastinal or definite hilar adenopathy,
given limitations of unenhanced CT. Tiny hiatal hernia.

Lungs/Pleura: No pleural fluid. Moderate centrilobular and
paraseptal emphysema.

Left lower lobe scarring.

No suspicious pulmonary nodule or mass.

Upper Abdomen: Normal imaged portions of the liver, spleen,
pancreas, adrenal glands, kidneys. Abdominal aortic atherosclerosis.

Musculoskeletal: Minimal S shaped thoracic spine curvature.
IMPRESSION: 1. Lung-RADS 1, negative. Continue annual screening with low-dose
chest CT without contrast in 12 months.
2. Aortic Atherosclerosis (8XXFV-TL6.6) and Emphysema ([id number].J).
Coronary artery atherosclerosis.
3.  Tiny hiatal hernia.

## 2021-08-20 ENCOUNTER — Telehealth: Payer: Self-pay

## 2021-08-20 NOTE — Telephone Encounter (Signed)
Unable to reach to schedule annual LDCT.  Phone rang and but was disconnected after 2 rings.unable to leave VM ?

## 2022-12-19 ENCOUNTER — Encounter: Payer: Self-pay | Admitting: Family Medicine

## 2022-12-22 ENCOUNTER — Encounter: Payer: Self-pay | Admitting: Family Medicine

## 2022-12-22 ENCOUNTER — Ambulatory Visit (INDEPENDENT_AMBULATORY_CARE_PROVIDER_SITE_OTHER): Payer: Medicare Other | Admitting: Family Medicine

## 2022-12-22 VITALS — BP 109/75 | HR 64 | Ht 69.5 in | Wt 144.9 lb

## 2022-12-22 DIAGNOSIS — Z0000 Encounter for general adult medical examination without abnormal findings: Secondary | ICD-10-CM | POA: Diagnosis not present

## 2022-12-22 DIAGNOSIS — I959 Hypotension, unspecified: Secondary | ICD-10-CM

## 2022-12-22 DIAGNOSIS — N401 Enlarged prostate with lower urinary tract symptoms: Secondary | ICD-10-CM | POA: Insufficient documentation

## 2022-12-22 DIAGNOSIS — H9193 Unspecified hearing loss, bilateral: Secondary | ICD-10-CM | POA: Diagnosis not present

## 2022-12-22 DIAGNOSIS — Z720 Tobacco use: Secondary | ICD-10-CM

## 2022-12-22 DIAGNOSIS — R7301 Impaired fasting glucose: Secondary | ICD-10-CM | POA: Diagnosis not present

## 2022-12-22 DIAGNOSIS — I2510 Atherosclerotic heart disease of native coronary artery without angina pectoris: Secondary | ICD-10-CM

## 2022-12-22 DIAGNOSIS — Z955 Presence of coronary angioplasty implant and graft: Secondary | ICD-10-CM

## 2022-12-22 NOTE — Progress Notes (Signed)
Annual Wellness Visit     Patient: Xavier Harrison, Male    DOB: November 06, 1955, 67 y.o.   MRN: [id number] Visit Date: 12/22/2022  Today's Provider: Jacky Kindle, FNP  Introduced to nurse practitioner role and practice setting.  All questions answered.  Discussed provider/patient relationship and expectations.  Chief Complaint  Patient presents with   Annual Exam    Patient reports he is consuming a general well balanced diet and he walks 9 hole 4-5 times a week. Patient reports he is feeling well and sleeping well. Patient reports he may need a referral due to hearing. Reports he is into hearing as good as he use to.    Subjective    Xavier Harrison is a 67 y.o. male who presents today for his Annual Wellness Visit. He reports consuming a general diet. The patient does not participate in regular exercise at present. He generally feels fairly well. He reports sleeping fairly well. He does have additional problems to discuss today.   HPI    Medications: Outpatient Medications Prior to Visit  Medication Sig   aspirin 81 MG chewable tablet Chew 81 mg every evening by mouth.   atorvastatin (LIPITOR) 80 MG tablet Take 1 tablet (80 mg total) by mouth daily at 6 PM.   calcium carbonate (TUMS - DOSED IN MG ELEMENTAL CALCIUM) 500 MG chewable tablet Chew 1 tablet daily as needed by mouth for indigestion or heartburn.   ibuprofen (ADVIL,MOTRIN) 200 MG tablet Take 400-600 mg 2 (two) times daily as needed by mouth for moderate pain.   metoprolol succinate (TOPROL-XL) 25 MG 24 hr tablet Take 12.5 mg by mouth daily.   sodium chloride (OCEAN) 0.65 % SOLN nasal spray Place 1 spray as needed into both nostrils for congestion.   [DISCONTINUED] metoprolol succinate (TOPROL-XL) 25 MG 24 hr tablet Take 25 mg every evening by mouth.    No facility-administered medications prior to visit.    No Known Allergies  Patient Care Team: Jacky Kindle, FNP as PCP - General (Family Medicine)  Review of  Systems  Last [facility] Lab Results  Component Value Date   WBC 10.0 12/22/2022   HGB 14.7 12/22/2022   HCT 44.2 12/22/2022   MCV 90 12/22/2022   MCH 30.0 12/22/2022   RDW 12.7 12/22/2022   PLT 206 12/22/2022   Last metabolic panel Lab Results  Component Value Date   GLUCOSE 91 12/22/2022   NA 139 12/22/2022   K 4.7 12/22/2022   CL 105 12/22/2022   CO2 22 12/22/2022   BUN 22 12/22/2022   CREATININE 0.93 12/22/2022   EGFR 91 12/22/2022   CALCIUM 9.2 12/22/2022   PROT 6.6 12/22/2022   ALBUMIN 4.2 12/22/2022   LABGLOB 2.4 12/22/2022   AGRATIO 1.8 07/26/2019   BILITOT 0.6 12/22/2022   ALKPHOS 74 12/22/2022   AST 23 12/22/2022   ALT 29 12/22/2022   Last lipids Lab Results  Component Value Date   CHOL 108 12/22/2022   HDL 50 12/22/2022   LDLCALC 41 12/22/2022   TRIG 86 12/22/2022   CHOLHDL 2.2 12/22/2022   Last hemoglobin A1c Lab Results  Component Value Date   HGBA1C 5.8 (H) 12/22/2022      Objective    Vitals: BP 109/75 (BP Location: Left Arm, Patient Position: Sitting, Cuff Size: Normal)   Pulse 64   Ht 5' 9.5" (1.765 m)   Wt 144 lb 14.4 oz (65.7 kg)   SpO2 100%   BMI  21.09 kg/m  BP Readings from Last 3 Encounters:  12/22/22 109/75  01/22/21 123/83  07/08/20 134/83   Physical Exam Vitals and nursing note reviewed.  Constitutional:      General: He is awake. He is not in acute distress.    Appearance: Normal appearance. He is well-developed, well-groomed and normal weight. He is not ill-appearing, toxic-appearing or diaphoretic.  HENT:     Head: Normocephalic and atraumatic.     Jaw: There is normal jaw occlusion. No trismus, tenderness, swelling or pain on movement.     Salivary Glands: Right salivary gland is not diffusely enlarged or tender. Left salivary gland is not diffusely enlarged or tender.     Right Ear: Hearing, tympanic membrane, ear canal and external ear normal. There is no impacted cerumen.     Left Ear: Hearing, tympanic membrane, ear  canal and external ear normal. There is no impacted cerumen.     Nose: Nose normal. No congestion or rhinorrhea.     Right Turbinates: Not enlarged, swollen or pale.     Left Turbinates: Not enlarged, swollen or pale.     Right Sinus: No maxillary sinus tenderness or frontal sinus tenderness.     Left Sinus: No maxillary sinus tenderness or frontal sinus tenderness.     Mouth/Throat:     Lips: Pink.     Mouth: Mucous membranes are moist. No injury, lacerations, oral lesions or angioedema.     Pharynx: Oropharynx is clear. Uvula midline. No pharyngeal swelling, oropharyngeal exudate or posterior oropharyngeal erythema.     Tonsils: No tonsillar exudate or tonsillar abscesses.  Eyes:     General: Lids are normal. Vision grossly intact. Gaze aligned appropriately.        Right eye: No discharge.        Left eye: No discharge.     Extraocular Movements: Extraocular movements intact.     Conjunctiva/sclera: Conjunctivae normal.     Pupils: Pupils are equal, round, and reactive to light.  Neck:     Thyroid: No thyroid mass, thyromegaly or thyroid tenderness.     Vascular: No carotid bruit.     Trachea: Trachea normal. No tracheal tenderness.  Cardiovascular:     Rate and Rhythm: Normal rate and regular rhythm.     Pulses: Normal pulses.          Carotid pulses are 2+ on the right side and 2+ on the left side.      Radial pulses are 2+ on the right side and 2+ on the left side.       Femoral pulses are 2+ on the right side and 2+ on the left side.      Popliteal pulses are 2+ on the right side and 2+ on the left side.       Dorsalis pedis pulses are 2+ on the right side and 2+ on the left side.       Posterior tibial pulses are 2+ on the right side and 2+ on the left side.     Heart sounds: Normal heart sounds, S1 normal and S2 normal. No murmur heard.    No friction rub. No gallop.  Pulmonary:     Effort: Pulmonary effort is normal. No respiratory distress.     Breath sounds: Normal  breath sounds and air entry. No stridor. No wheezing, rhonchi or rales.  Chest:     Chest wall: No tenderness.  Abdominal:     General: Abdomen is flat. Bowel sounds are  normal. There is no distension.     Palpations: Abdomen is soft. There is no mass.     Tenderness: There is no abdominal tenderness. There is no guarding or rebound.     Hernia: No hernia is present.  Genitourinary:    Comments: Exam deferred; denies complaints Musculoskeletal:        General: No swelling, tenderness, deformity or signs of injury. Normal range of motion.     Cervical back: Normal range of motion and neck supple. No rigidity or tenderness.     Right lower leg: No edema.     Left lower leg: No edema.  Lymphadenopathy:     Cervical: No cervical adenopathy.     Right cervical: No superficial, deep or posterior cervical adenopathy.    Left cervical: No superficial, deep or posterior cervical adenopathy.  Skin:    General: Skin is warm and dry.     Capillary Refill: Capillary refill takes less than 2 seconds.     Coloration: Skin is not jaundiced or pale.     Findings: No bruising, erythema, lesion or rash.  Neurological:     General: No focal deficit present.     Mental Status: He is alert and oriented to person, place, and time. Mental status is at baseline.     GCS: GCS eye subscore is 4. GCS verbal subscore is 5. GCS motor subscore is 6.     Sensory: Sensation is intact. No sensory deficit.     Motor: Motor function is intact. No weakness.     Coordination: Coordination is intact.     Gait: Gait is intact.  Psychiatric:        Attention and Perception: Attention and perception normal.        Mood and Affect: Mood and affect normal.        Speech: Speech normal.        Behavior: Behavior normal. Behavior is cooperative.        Thought Content: Thought content normal.        Cognition and Memory: Cognition normal.        Judgment: Judgment normal.    Most recent functional status assessment:      No data to display         Most recent fall risk assessment:    12/22/2022    9:35 AM  Fall Risk   Falls in the past year? 0  Injury with Fall? 0  Risk for fall due to : No Fall Risks  Follow up Falls evaluation completed    Most recent depression screenings:    12/22/2022    9:35 AM 01/22/2021   10:34 AM  PHQ 2/9 Scores  PHQ - 2 Score 0 0  PHQ- 9 Score  0   Most recent cognitive screening:     No data to display         Most recent Audit-C alcohol use screening    01/22/2021   10:32 AM  Alcohol Use Disorder Test (AUDIT)  1. How often do you have a drink containing alcohol? 1  2. How many drinks containing alcohol do you have on a typical day when you are drinking? 0  3. How often do you have six or more drinks on one occasion? 0  AUDIT-C Score 1   A score of 3 or more in women, and 4 or more in men indicates increased risk for alcohol abuse, EXCEPT if all of the points are from question 1  Results for orders placed or performed in visit on 12/22/22  CBC with Differential/Platelet  Result Value Ref Range   WBC 10.0 3.4 - 10.8 x10E3/uL   RBC 4.90 4.14 - 5.80 x10E6/uL   Hemoglobin 14.7 13.0 - 17.7 g/dL   Hematocrit 65.7 84.6 - 51.0 %   MCV 90 79 - 97 fL   MCH 30.0 26.6 - 33.0 pg   MCHC 33.3 31.5 - 35.7 g/dL   RDW 96.2 95.2 - 84.1 %   Platelets 206 150 - 450 x10E3/uL   Neutrophils 57 Not Estab. %   Lymphs 28 Not Estab. %   Monocytes 11 Not Estab. %   Eos 3 Not Estab. %   Basos 1 Not Estab. %   Neutrophils Absolute 5.8 1.4 - 7.0 x10E3/uL   Lymphocytes Absolute 2.8 0.7 - 3.1 x10E3/uL   Monocytes Absolute 1.1 (H) 0.1 - 0.9 x10E3/uL   EOS (ABSOLUTE) 0.3 0.0 - 0.4 x10E3/uL   Basophils Absolute 0.1 0.0 - 0.2 x10E3/uL   Immature Granulocytes 0 Not Estab. %   Immature Grans (Abs) 0.0 0.0 - 0.1 x10E3/uL  Comprehensive Metabolic Panel (CMET)  Result Value Ref Range   Glucose 91 70 - 99 mg/dL   BUN 22 8 - 27 mg/dL   Creatinine, Ser 3.24 0.76 - 1.27 mg/dL    eGFR 91 >40 NU/UVO/5.36   BUN/Creatinine Ratio 24 10 - 24   Sodium 139 134 - 144 mmol/L   Potassium 4.7 3.5 - 5.2 mmol/L   Chloride 105 96 - 106 mmol/L   CO2 22 20 - 29 mmol/L   Calcium 9.2 8.6 - 10.2 mg/dL   Total Protein 6.6 6.0 - 8.5 g/dL   Albumin 4.2 3.9 - 4.9 g/dL   Globulin, Total 2.4 1.5 - 4.5 g/dL   Bilirubin Total 0.6 0.0 - 1.2 mg/dL   Alkaline Phosphatase 74 44 - 121 IU/L   AST 23 0 - 40 IU/L   ALT 29 0 - 44 IU/L  TSH+T4F+T3Free  Result Value Ref Range   TSH 0.898 0.450 - 4.500 uIU/mL   T3, Free 3.8 2.0 - 4.4 pg/mL   Free T4 1.05 0.82 - 1.77 ng/dL  Lipid panel  Result Value Ref Range   Cholesterol, Total 108 100 - 199 mg/dL   Triglycerides 86 0 - 149 mg/dL   HDL 50 >64 mg/dL   VLDL Cholesterol Cal 17 5 - 40 mg/dL   LDL Chol Calc (NIH) 41 0 - 99 mg/dL   Chol/HDL Ratio 2.2 0.0 - 5.0 ratio  Hemoglobin A1c  Result Value Ref Range   Hgb A1c MFr Bld 5.8 (H) 4.8 - 5.6 %   Est. average glucose Bld gHb Est-mCnc 120 mg/dL  PSA  Result Value Ref Range   Prostate Specific Ag, Serum 0.5 0.0 - 4.0 ng/mL    Assessment & Plan     Annual wellness visit done today including the all of the following: Reviewed patient's Family Medical History Reviewed and updated list of patient's medical providers Assessment of cognitive impairment was done Assessed patient's functional ability Established a written schedule for health screening services Health Risk Assessent Completed and Reviewed  Exercise Activities and Dietary recommendations  Goals   None     Immunization History  Administered Date(s) Administered   Moderna Sars-Covid-2 Vaccination 10/20/2019, 11/17/2019, 06/02/2020   PNEUMOCOCCAL CONJUGATE-20 01/22/2021   Pneumococcal Polysaccharide-23 03/16/2015   Tdap 03/16/2015   Zoster, Live 01/22/2021    Health Maintenance  Topic Date Due   Zoster  Vaccines- Shingrix (1 of 2) 06/08/1975   Lung Cancer Screening  05/07/2021   COVID-19 Vaccine (4 - 2023-24 season)  01/28/2022   INFLUENZA VACCINE  12/29/2022   Medicare Annual Wellness (AWV)  12/22/2023   Fecal DNA (Cologuard)  02/03/2024   DTaP/Tdap/Td (2 - Td or Tdap) 03/15/2025   Pneumonia Vaccine 62+ Years old  Completed   Hepatitis C Screening  Completed   HPV VACCINES  Aged Out     Discussed health benefits of physical activity, and encouraged him to engage in regular exercise appropriate for his age and condition.    Problem List Items Addressed This Visit       Cardiovascular and Mediastinum   Hypotension    Acute on chronic, stable at this time Continue to monitor s/s of dizziness or tinnitus or palpitations      Relevant Medications   metoprolol succinate (TOPROL-XL) 25 MG 24 hr tablet   Other Relevant Orders   CBC with Differential/Platelet (Completed)   Comprehensive Metabolic Panel (CMET) (Completed)   TSH+T4F+T3Free (Completed)     Endocrine   Impaired fasting glucose    Recommend A1c; A1c noted prediabetes Continue to recommend balanced, lower carb meals. Smaller meal size, adding snacks. Choosing water as drink of choice and increasing purposeful exercise.       Relevant Orders   Hemoglobin A1c (Completed)     Nervous and Auditory   Bilateral hearing loss    Referral to ENT to assist      Relevant Orders   Ambulatory referral to ENT     Genitourinary   Benign localized prostatic hyperplasia with lower urinary tract symptoms (LUTS)    Endorses complaints of LUTS; recommend PSA in place of DRE. If PSA is elevated for age, we will repeat; if PSA remains elevated pt will be referred to urology for DRE and next steps for best treatment.       Relevant Orders   PSA (Completed)     Other   Encounter for subsequent annual wellness visit (AWV) in Medicare patient - Primary    Things to do to keep yourself healthy  - Exercise at least 30-45 minutes a day, 3-4 days a week.  - Eat a low-fat diet with lots of fruits and vegetables, up to 7-9 servings per day.  -  Seatbelts can save your life. Wear them always.  - Smoke detectors on every level of your home, check batteries every year.  - Eye Doctor - have an eye exam every 1-2 years  - Safe sex - if you may be exposed to STDs, use a condom.  - Alcohol -  If you drink, do it moderately, less than 2 drinks per day.  - Health Care Power of Attorney. Choose someone to speak for you if you are not able.  - Depression is common in our stressful world.If you're feeling down or losing interest in things you normally enjoy, please come in for a visit.  - Violence - If anyone is threatening or hurting you, please call immediately.  Due for shingrix; agreeable to lung cancer screening given chronic and current use of tobacco products      Relevant Orders   VAS [place] AAA DUPLEX   S/P drug eluting coronary stent placement    Chronic, continue to monitor LP and ASCVD risk recommend diet low in saturated fat and regular exercise - 30 min at least 5 times per week       Relevant Orders   CBC  with Differential/Platelet (Completed)   Comprehensive Metabolic Panel (CMET) (Completed)   Lipid panel (Completed)   Tobacco abuse    Chronic, stable Remains pre contemplative at this time iso quitting       Relevant Orders   Ambulatory Referral Lung Cancer Screening [facility]   VAS [place] AAA DUPLEX   Other Visit Diagnoses     ASCVD (arteriosclerotic cardiovascular disease)       Relevant Medications   metoprolol succinate (TOPROL-XL) 25 MG 24 hr tablet   Other Relevant Orders   Lipid panel (Completed)      Return in about 6 months (around 06/24/2023) for chonic disease management.    Leilani Merl, FNP, have reviewed all documentation for this visit. The documentation on 12/27/22 for the exam, diagnosis, procedures, and orders are all accurate and complete.  Jacky Kindle, FNP  [facility] [phone] (phone) [phone] (fax)  [facility]

## 2022-12-23 LAB — LIPID PANEL: Triglycerides: 86 mg/dL (ref 0–149)

## 2022-12-23 NOTE — Progress Notes (Signed)
Stable pre-diabetes. Continue to recommend balanced, lower carb meals. Smaller meal size, adding snacks. Choosing water as drink of choice and increasing purposeful exercise.  All other labs are normal and stable including lipids.  Proceed with screening for AAA and Lung cancer screening. ENT referral is also pending.

## 2022-12-27 ENCOUNTER — Encounter: Payer: Self-pay | Admitting: Family Medicine

## 2022-12-27 NOTE — Assessment & Plan Note (Signed)
Acute on chronic, stable at this time Continue to monitor s/s of dizziness or tinnitus or palpitations

## 2022-12-27 NOTE — Assessment & Plan Note (Signed)
Recommend A1c; A1c noted prediabetes Continue to recommend balanced, lower carb meals. Smaller meal size, adding snacks. Choosing water as drink of choice and increasing purposeful exercise.

## 2022-12-27 NOTE — Patient Instructions (Signed)
The CDC recommends two doses of Shingrix (the new shingles vaccine) separated by 2 to 6 months for adults age 66 years and older. I recommend checking with your insurance plan regarding coverage for this vaccine.    

## 2022-12-27 NOTE — Assessment & Plan Note (Signed)
Chronic, stable Remains pre contemplative at this time iso quitting

## 2022-12-27 NOTE — Assessment & Plan Note (Signed)

## 2022-12-27 NOTE — Assessment & Plan Note (Signed)
Endorses complaints of LUTS; recommend PSA in place of DRE. If PSA is elevated for age, we will repeat; if PSA remains elevated pt will be referred to urology for DRE and next steps for best treatment.

## 2022-12-27 NOTE — Assessment & Plan Note (Signed)
Referral to ENT to assist

## 2022-12-27 NOTE — Assessment & Plan Note (Signed)
Chronic, continue to monitor LP and ASCVD risk recommend diet low in saturated fat and regular exercise - 30 min at least 5 times per week

## 2022-12-30 ENCOUNTER — Ambulatory Visit (INDEPENDENT_AMBULATORY_CARE_PROVIDER_SITE_OTHER): Payer: Medicare Other

## 2022-12-30 DIAGNOSIS — Z720 Tobacco use: Secondary | ICD-10-CM | POA: Diagnosis not present

## 2022-12-30 DIAGNOSIS — Z0000 Encounter for general adult medical examination without abnormal findings: Secondary | ICD-10-CM | POA: Diagnosis not present

## 2023-01-02 ENCOUNTER — Telehealth: Payer: Self-pay

## 2023-01-02 NOTE — Telephone Encounter (Signed)
Copied from CRM [id number]. Topic: Referral - Status >> Jan 02, 2023 10:27 AM [name] wrote: Reason for CRM: Pt states that he called [facility] and they are stating that they never received a referral for him. It is showing referral was sent over on 12/28/2022. Pt would like a call back to discuss.

## 2023-03-14 ENCOUNTER — Other Ambulatory Visit: Payer: Self-pay

## 2023-03-14 DIAGNOSIS — F17210 Nicotine dependence, cigarettes, uncomplicated: Secondary | ICD-10-CM

## 2023-03-14 DIAGNOSIS — Z87891 Personal history of nicotine dependence: Secondary | ICD-10-CM

## 2023-03-14 DIAGNOSIS — Z122 Encounter for screening for malignant neoplasm of respiratory organs: Secondary | ICD-10-CM

## 2023-03-22 ENCOUNTER — Ambulatory Visit
Admission: RE | Admit: 2023-03-22 | Discharge: 2023-03-22 | Disposition: A | Discharge status: Home / Self Care | Payer: Medicare Other | Source: Ambulatory Visit | Attending: Acute Care | Admitting: Acute Care

## 2023-03-22 DIAGNOSIS — F17210 Nicotine dependence, cigarettes, uncomplicated: Secondary | ICD-10-CM | POA: Diagnosis present

## 2023-03-22 DIAGNOSIS — Z87891 Personal history of nicotine dependence: Secondary | ICD-10-CM | POA: Diagnosis present

## 2023-03-22 DIAGNOSIS — Z122 Encounter for screening for malignant neoplasm of respiratory organs: Secondary | ICD-10-CM | POA: Diagnosis present

## 2023-04-11 ENCOUNTER — Telehealth: Payer: Self-pay

## 2023-04-11 NOTE — Telephone Encounter (Signed)
Copied from CRM [id number]. Topic: General - Other >> Apr 11, 2023 12:26 PM Dominique A wrote: Reason for CRM: Pt is calling in regards to his lung scan that he had done 3 weeks ago and is wanting his results. Please advise.

## 2023-04-17 ENCOUNTER — Other Ambulatory Visit: Payer: Self-pay | Admitting: Acute Care

## 2023-04-17 DIAGNOSIS — Z122 Encounter for screening for malignant neoplasm of respiratory organs: Secondary | ICD-10-CM

## 2023-04-17 DIAGNOSIS — F17210 Nicotine dependence, cigarettes, uncomplicated: Secondary | ICD-10-CM

## 2023-04-17 DIAGNOSIS — Z87891 Personal history of nicotine dependence: Secondary | ICD-10-CM

## 2023-04-17 NOTE — Telephone Encounter (Signed)
It was ordered by pulmonology but I have called the reading room and they will read it ASAP.

## 2023-10-17 ENCOUNTER — Encounter (INDEPENDENT_AMBULATORY_CARE_PROVIDER_SITE_OTHER): Payer: Self-pay

## 2024-04-03 ENCOUNTER — Encounter: Payer: Self-pay | Admitting: Physician Assistant

## 2024-04-03 ENCOUNTER — Ambulatory Visit (INDEPENDENT_AMBULATORY_CARE_PROVIDER_SITE_OTHER): Admitting: Physician Assistant

## 2024-04-03 VITALS — BP 130/89 | HR 75 | Resp 16 | Ht 69.5 in | Wt 146.8 lb

## 2024-04-03 DIAGNOSIS — Z125 Encounter for screening for malignant neoplasm of prostate: Secondary | ICD-10-CM | POA: Insufficient documentation

## 2024-04-03 DIAGNOSIS — E049 Nontoxic goiter, unspecified: Secondary | ICD-10-CM | POA: Insufficient documentation

## 2024-04-03 DIAGNOSIS — Z0001 Encounter for general adult medical examination with abnormal findings: Secondary | ICD-10-CM | POA: Diagnosis not present

## 2024-04-03 DIAGNOSIS — I2510 Atherosclerotic heart disease of native coronary artery without angina pectoris: Secondary | ICD-10-CM | POA: Diagnosis not present

## 2024-04-03 DIAGNOSIS — Z1211 Encounter for screening for malignant neoplasm of colon: Secondary | ICD-10-CM

## 2024-04-03 DIAGNOSIS — R7301 Impaired fasting glucose: Secondary | ICD-10-CM | POA: Diagnosis not present

## 2024-04-03 DIAGNOSIS — Z720 Tobacco use: Secondary | ICD-10-CM | POA: Diagnosis not present

## 2024-04-03 DIAGNOSIS — Z0000 Encounter for general adult medical examination without abnormal findings: Secondary | ICD-10-CM

## 2024-04-03 DIAGNOSIS — Z955 Presence of coronary angioplasty implant and graft: Secondary | ICD-10-CM

## 2024-04-03 DIAGNOSIS — R6889 Other general symptoms and signs: Secondary | ICD-10-CM | POA: Insufficient documentation

## 2024-04-03 DIAGNOSIS — Z122 Encounter for screening for malignant neoplasm of respiratory organs: Secondary | ICD-10-CM

## 2024-04-03 DIAGNOSIS — N401 Enlarged prostate with lower urinary tract symptoms: Secondary | ICD-10-CM

## 2024-04-03 NOTE — Progress Notes (Signed)
 Annual Wellness Visit     Patient: Xavier Harrison, Male    DOB: September 10, 1955, 68 y.o.   MRN: [id number] Visit Date: 04/03/2024  Today's Provider: Jaysa Kise, PA-C   Chief Complaint  Patient presents with   Transitions Of Care   Medicare Wellness   Subjective    Xavier Harrison is a 68 y.o. male who presents today for his Annual Wellness Visit.  Discussed the use of AI scribe software for clinical note transcription with the patient, who gave verbal consent to proceed.  History of Present Illness [name] is a 68 year old male with coronary artery disease who presents for a routine follow-up.  He has coronary artery disease with a stent placement and follows up with his cardiologist annually. He smokes eight to nine cigarettes daily and takes atorvastatin  and a baby aspirin . He denies chest pain, shortness of breath, or palpitations. He experiences a morning and evening cough, attributed to clearing congestion.  He has prediabetes, with his last blood work done a year ago.  He has a hiatal hernia, causing occasional food obstruction, managed by drinking fluids.  He had shingles and received the first vaccine two years ago but did not complete the series. He is up to date with dental visits and last saw an eye doctor a year and a half ago.   Medications: Outpatient Medications Prior to Visit  Medication Sig   aspirin  81 MG chewable tablet Chew 81 mg every evening by mouth.   atorvastatin  (LIPITOR ) 40 MG tablet Take 40 mg by mouth.   calcium  carbonate (TUMS - DOSED IN MG ELEMENTAL CALCIUM ) 500 MG chewable tablet Chew 1 tablet daily as needed by mouth for indigestion or heartburn.   ibuprofen (ADVIL,MOTRIN) 200 MG tablet Take 400-600 mg 2 (two) times daily as needed by mouth for moderate pain.   sodium chloride  (OCEAN) 0.65 % SOLN nasal spray Place 1 spray as needed into both nostrils for congestion.   atorvastatin  (LIPITOR ) 80 MG tablet Take 1 tablet (80 mg total) by mouth daily  at 6 PM.   metoprolol  succinate (TOPROL -XL) 25 MG 24 hr tablet Take 12.5 mg by mouth daily.   No facility-administered medications prior to visit.    No Known Allergies  Patient Care Team: Trayvon Trumbull, PA-C as PCP - General (Physician Assistant)  Review of Systems  All other systems reviewed and are negative.        Objective    Vitals: BP 130/89 (BP Location: Left Arm, Patient Position: Sitting, Cuff Size: Normal)   Pulse 75   Resp 16   Ht 5' 9.5 (1.765 m)   Wt 146 lb 12.8 oz (66.6 kg)   SpO2 98%   BMI 21.37 kg/m      Physical Exam Vitals reviewed.  Constitutional:      General: He is not in acute distress.    Appearance: Normal appearance. He is well-developed. He is not ill-appearing, toxic-appearing or diaphoretic.  HENT:     Head: Normocephalic and atraumatic.     Right Ear: Tympanic membrane, ear canal and external ear normal.     Left Ear: Tympanic membrane, ear canal and external ear normal.     Nose: Nose normal. No congestion or rhinorrhea.     Mouth/Throat:     Mouth: Mucous membranes are moist.     Pharynx: Oropharynx is clear. No oropharyngeal exudate.  Eyes:     General: No scleral icterus.  Right eye: No discharge.        Left eye: No discharge.     Conjunctiva/sclera: Conjunctivae normal.     Pupils: Pupils are equal, round, and reactive to light.  Neck:     Thyroid: No thyromegaly.     Vascular: No carotid bruit.  Cardiovascular:     Rate and Rhythm: Normal rate and regular rhythm.     Pulses: Normal pulses.     Heart sounds: Normal heart sounds. No murmur heard.    No friction rub. No gallop.  Pulmonary:     Effort: Pulmonary effort is normal. No respiratory distress.     Breath sounds: Normal breath sounds. No wheezing or rales.  Abdominal:     General: Abdomen is flat. Bowel sounds are normal. There is no distension.     Palpations: Abdomen is soft. There is no mass.     Tenderness: There is no abdominal tenderness. There is  no right CVA tenderness, left CVA tenderness, guarding or rebound.     Hernia: No hernia is present.  Musculoskeletal:        General: No swelling, tenderness, deformity or signs of injury. Normal range of motion.     Cervical back: Normal range of motion and neck supple. No rigidity or tenderness.     Right lower leg: No edema.     Left lower leg: No edema.  Lymphadenopathy:     Cervical: No cervical adenopathy.  Skin:    General: Skin is warm and dry.     Coloration: Skin is not jaundiced or pale.     Findings: No bruising, erythema, lesion or rash.  Neurological:     Mental Status: He is alert and oriented to person, place, and time. Mental status is at baseline.     Gait: Gait normal.  Psychiatric:        Mood and Affect: Mood normal.        Behavior: Behavior normal.        Thought Content: Thought content normal.        Judgment: Judgment normal.     Most recent functional status assessment:     No data to display         Most recent fall risk assessment:    04/03/2024    2:49 PM  Fall Risk   Falls in the past year? 0  Number falls in past yr: 0  Injury with Fall? 0  Risk for fall due to : No Fall Risks    Most recent depression screenings:    04/03/2024    2:48 PM 12/22/2022    9:35 AM  PHQ 2/9 Scores  PHQ - 2 Score 0 0   Most recent cognitive screening:    04/03/2024    2:49 PM  6CIT Screen  What Year? 0 points  What month? 0 points  What time? 0 points  Count back from 20 0 points  Months in reverse 0 points  Repeat phrase 0 points  Total Score 0 points   Most recent Audit-C alcohol use screening    01/22/2021   10:32 AM  Alcohol Use Disorder Test (AUDIT)  1. How often do you have a drink containing alcohol? 1  2. How many drinks containing alcohol do you have on a typical day when you are drinking? 0  3. How often do you have six or more drinks on one occasion? 0  AUDIT-C Score 1   A score of 3 or more in  women, and 4 or more in men  indicates increased risk for alcohol abuse, EXCEPT if all of the points are from question 1   No results found for any visits on 04/03/24.  Assessment & Plan      Encounter for subsequent annual wellness visit (AWV) in Medicare patient (Primary)  Annual wellness visit done today including the all of the following: Reviewed patient's Family Medical History Reviewed and updated list of patient's medical providers Assessment of cognitive impairment was done Assessed patient's functional ability Established a written schedule for health screening services Health Risk Assessent Completed and Reviewed  Exercise Activities and Dietary recommendations  Goals      DIET - EAT MORE FRUITS AND VEGETABLES     Exercise 150 min/wk Moderate Activity        Immunization History  Administered Date(s) Administered   Moderna Sars-Covid-2 Vaccination 10/20/2019, 11/17/2019, 06/02/2020   PNEUMOCOCCAL CONJUGATE-20 01/22/2021   Pneumococcal Polysaccharide-23 03/16/2015   Tdap 03/16/2015   Zoster, Live 01/22/2021    Health Maintenance  Topic Date Due   Zoster Vaccines- Shingrix (1 of 2) 06/08/1975   Medicare Annual Wellness (AWV)  12/22/2023   COVID-19 Vaccine (4 - 2025-26 season) 01/29/2024   Fecal DNA (Cologuard)  02/03/2024   Lung Cancer Screening  03/21/2024   Influenza Vaccine  08/27/2024 (Originally 12/29/2023)   DTaP/Tdap/Td (2 - Td or Tdap) 03/15/2025   Pneumococcal Vaccine: 50+ Years  Completed   Hepatitis C Screening  Completed   Meningococcal B Vaccine  Aged Out     Discussed health benefits of physical activity, and encouraged him to engage in regular exercise appropriate for his age and condition.   Assessment & Plan Adult Wellness Visit Routine wellness exam with no acute concerns. Discussed general health maintenance, including vaccinations and screenings. - Performed lab work as part of wellness exam. - Discussed COVID and shingles vaccines; recommended obtaining through  pharmacy.  Atherosclerotic heart disease of native coronary artery with coronary stent Coronary artery disease with stent placement. No current chest pain, dyspnea, or palpitations. Continues follow-up with cardiologist. - Continue atorvastatin  and baby aspirin . - Continue annual follow-up with cardiologist.  Tobacco use Continues to smoke approximately 8-9 cigarettes per day. Acknowledges ongoing struggle with smoking cessation. - Encouraged reduction in smoking.  Impaired fasting glucose (prediabetes) Previously diagnosed with prediabetes. No recent blood glucose testing reported. - Ordered blood glucose testing.  Benign prostatic hyperplasia with lower urinary tract symptoms No current symptoms or issues reported. No urologist follow-up. - Discussed annual PSA screening; recommended lab work for PSA.  Nontoxic goiter, unspecified Possible enlargement of thyroid gland noted. No symptoms reported. Discussed potential need for ultrasound to assess thyroid size. - Ordered thyroid ultrasound to assess gland size.  Screening for malignant neoplasm of prostate No current symptoms or issues reported. Last PSA screening was one year ago. - Ordered PSA screening as part of lab work.  General Health Maintenance Discussed general health maintenance including vaccinations and screenings. No recent eye or dental exams reported. - Recommended COVID and shingles vaccines. - Encouraged regular eye and dental exams.   Tobacco abuse  - Ambulatory Referral for Lung Cancer Screening [REF832]  Coronary artery disease involving native coronary artery of native heart without angina pectoris ASCVD (arteriosclerotic cardiovascular disease) S/P drug eluting coronary stent placement  - Comprehensive metabolic panel with GFR - CBC with Differential/Platelet - Lipid panel   Impaired fasting glucose  - Comprehensive metabolic panel with GFR - CBC with Differential/Platelet  Benign localized  prostatic hyperplasia with lower urinary tract symptoms (LUTS) PSA  Screening for malignant neoplasm of respiratory organ Current cigarette smoker - Ambulatory Referral for Lung Cancer Screening [REF832]  Screen for colon cancer - Cologuard  Prostate cancer screening - PSA Total (Reflex To Free)  Goiter Cold intolerance - TSH - T4, free - US  THYROID; Future  Return in about 1 year (around 04/03/2025) for AW in a year +1d.     The patient was advised to call back or seek an in-person evaluation if the symptoms worsen or if the condition fails to improve as anticipated.  I discussed the assessment and treatment plan with the patient. The patient was provided an opportunity to ask questions and all were answered. The patient agreed with the plan and demonstrated an understanding of the instructions.  I, Jaystin Mcgarvey, PA-C have reviewed all documentation for this visit. The documentation on 04/03/2024  for the exam, diagnosis, procedures, and orders are all accurate and complete.   Jolynn Spencer, PA-C  [facility] [phone] (phone) [phone] (fax)  [facility]

## 2024-04-03 NOTE — Patient Instructions (Signed)
 Please, have covid and zoster vaccines through [facility]

## 2024-04-09 ENCOUNTER — Ambulatory Visit: Payer: Self-pay | Admitting: Physician Assistant

## 2024-04-09 LAB — CBC WITH DIFFERENTIAL/PLATELET
Basophils Absolute: 0.1 x10E3/uL (ref 0.0–0.2)
Basos: 1 %
EOS (ABSOLUTE): 0.3 x10E3/uL (ref 0.0–0.4)
Eos: 3 %
Hematocrit: 45.2 % (ref 37.5–51.0)
Hemoglobin: 15.1 g/dL (ref 13.0–17.7)
Immature Grans (Abs): 0 x10E3/uL (ref 0.0–0.1)
Immature Granulocytes: 0 %
Lymphocytes Absolute: 3 x10E3/uL (ref 0.7–3.1)
Lymphs: 30 %
MCH: 31.1 pg (ref 26.6–33.0)
MCHC: 33.4 g/dL (ref 31.5–35.7)
MCV: 93 fL (ref 79–97)
Monocytes Absolute: 1 x10E3/uL — ABNORMAL HIGH (ref 0.1–0.9)
Monocytes: 10 %
Neutrophils Absolute: 5.7 x10E3/uL (ref 1.4–7.0)
Neutrophils: 56 %
Platelets: 235 x10E3/uL (ref 150–450)
RBC: 4.85 x10E6/uL (ref 4.14–5.80)
RDW: 12.4 % (ref 11.6–15.4)
WBC: 10.2 x10E3/uL (ref 3.4–10.8)

## 2024-04-09 LAB — COMPREHENSIVE METABOLIC PANEL WITH GFR
ALT: 27 IU/L (ref 0–44)
AST: 22 IU/L (ref 0–40)
Albumin: 4.1 g/dL (ref 3.9–4.9)
Alkaline Phosphatase: 76 IU/L (ref 47–123)
BUN/Creatinine Ratio: 14 (ref 10–24)
BUN: 15 mg/dL (ref 8–27)
Bilirubin Total: 0.6 mg/dL (ref 0.0–1.2)
CO2: 20 mmol/L (ref 20–29)
Calcium: 9.6 mg/dL (ref 8.6–10.2)
Chloride: 105 mmol/L (ref 96–106)
Creatinine, Ser: 1.05 mg/dL (ref 0.76–1.27)
Globulin, Total: 2.5 g/dL (ref 1.5–4.5)
Glucose: 94 mg/dL (ref 70–99)
Potassium: 4.6 mmol/L (ref 3.5–5.2)
Sodium: 139 mmol/L (ref 134–144)
Total Protein: 6.6 g/dL (ref 6.0–8.5)
eGFR: 78 mL/min/1.73 (ref >59)

## 2024-04-09 LAB — LIPID PANEL
Chol/HDL Ratio: 2.4 ratio (ref 0.0–5.0)
Cholesterol, Total: 112 mg/dL (ref 100–199)
HDL: 46 mg/dL (ref >39)
LDL Chol Calc (NIH): 47 mg/dL (ref 0–99)
Triglycerides: 98 mg/dL (ref 0–149)
VLDL Cholesterol Cal: 19 mg/dL (ref 5–40)

## 2024-04-09 LAB — T4, FREE: Free T4: 0.97 ng/dL (ref 0.82–1.77)

## 2024-04-09 LAB — PSA TOTAL (REFLEX TO FREE): Prostate Specific Ag, Serum: 0.5 ng/mL (ref 0.0–4.0)

## 2024-04-09 LAB — TSH: TSH: 1.98 u[IU]/mL (ref 0.450–4.500)

## 2024-04-16 ENCOUNTER — Ambulatory Visit
Admission: RE | Admit: 2024-04-16 | Discharge: 2024-04-16 | Disposition: A | Discharge status: Home / Self Care | Source: Ambulatory Visit | Attending: Acute Care | Admitting: Acute Care

## 2024-04-16 DIAGNOSIS — Z87891 Personal history of nicotine dependence: Secondary | ICD-10-CM | POA: Diagnosis present

## 2024-04-16 DIAGNOSIS — Z122 Encounter for screening for malignant neoplasm of respiratory organs: Secondary | ICD-10-CM | POA: Diagnosis present

## 2024-04-16 DIAGNOSIS — F17210 Nicotine dependence, cigarettes, uncomplicated: Secondary | ICD-10-CM | POA: Insufficient documentation

## 2024-04-18 ENCOUNTER — Ambulatory Visit
Admission: RE | Admit: 2024-04-18 | Discharge: 2024-04-18 | Disposition: A | Discharge status: Home / Self Care | Source: Ambulatory Visit | Attending: Physician Assistant | Admitting: Physician Assistant

## 2024-04-18 DIAGNOSIS — E049 Nontoxic goiter, unspecified: Secondary | ICD-10-CM | POA: Diagnosis present

## 2024-04-18 LAB — COLOGUARD: COLOGUARD: NEGATIVE

## 2024-04-24 ENCOUNTER — Other Ambulatory Visit: Payer: Self-pay | Admitting: Acute Care

## 2024-04-24 DIAGNOSIS — F17210 Nicotine dependence, cigarettes, uncomplicated: Secondary | ICD-10-CM

## 2024-04-24 DIAGNOSIS — Z87891 Personal history of nicotine dependence: Secondary | ICD-10-CM

## 2024-04-24 DIAGNOSIS — Z122 Encounter for screening for malignant neoplasm of respiratory organs: Secondary | ICD-10-CM
